# Patient Record
Sex: Female | Born: 1965 | Race: White | Hispanic: No | Marital: Married | Smoking: Never smoker
Health system: Southern US, Community
[De-identification: ages and names within clinical notes are randomized; demographics above are authoritative.]

## PROBLEM LIST (undated history)

## (undated) DIAGNOSIS — I10 Essential (primary) hypertension: Secondary | ICD-10-CM

## (undated) DIAGNOSIS — G500 Trigeminal neuralgia: Secondary | ICD-10-CM

## (undated) DIAGNOSIS — K219 Gastro-esophageal reflux disease without esophagitis: Secondary | ICD-10-CM

## (undated) DIAGNOSIS — G4730 Sleep apnea, unspecified: Secondary | ICD-10-CM

## (undated) DIAGNOSIS — I4891 Unspecified atrial fibrillation: Secondary | ICD-10-CM

## (undated) DIAGNOSIS — D497 Neoplasm of unspecified behavior of endocrine glands and other parts of nervous system: Secondary | ICD-10-CM

## (undated) HISTORY — PX: BREAST SURGERY: SHX581

## (undated) HISTORY — PX: CHOLECYSTECTOMY: SHX55

---

## 2008-10-18 ENCOUNTER — Emergency Department (HOSPITAL_BASED_OUTPATIENT_CLINIC_OR_DEPARTMENT_OTHER): Admission: EM | Admit: 2008-10-18 | Discharge: 2008-10-19 | Payer: Self-pay | Admitting: Emergency Medicine

## 2008-11-16 ENCOUNTER — Ambulatory Visit (HOSPITAL_COMMUNITY): Admission: RE | Admit: 2008-11-16 | Discharge: 2008-11-16 | Payer: Self-pay | Admitting: Cardiovascular Disease

## 2009-04-17 ENCOUNTER — Emergency Department (HOSPITAL_BASED_OUTPATIENT_CLINIC_OR_DEPARTMENT_OTHER): Admission: EM | Admit: 2009-04-17 | Discharge: 2009-04-17 | Payer: Self-pay | Admitting: Emergency Medicine

## 2009-04-17 ENCOUNTER — Ambulatory Visit: Payer: Self-pay | Admitting: Diagnostic Radiology

## 2010-08-02 ENCOUNTER — Emergency Department (HOSPITAL_BASED_OUTPATIENT_CLINIC_OR_DEPARTMENT_OTHER): Admission: EM | Admit: 2010-08-02 | Discharge: 2010-08-02 | Payer: Self-pay | Admitting: Emergency Medicine

## 2011-03-19 LAB — BASIC METABOLIC PANEL
CO2: 30 mEq/L (ref 19–32)
Calcium: 8.8 mg/dL (ref 8.4–10.5)
Chloride: 103 mEq/L (ref 96–112)
GFR calc non Af Amer: >60 mL/min (ref >60)
Glucose, Bld: 92 mg/dL (ref 70–99)
Potassium: 3.9 mEq/L (ref 3.5–5.1)
Sodium: 141 mEq/L (ref 135–145)

## 2011-03-19 LAB — POCT CARDIAC MARKERS: Myoglobin, poc: 42.7 ng/mL (ref 12–200)

## 2011-03-19 LAB — CBC
Hemoglobin: 13.5 g/dL (ref 12.0–15.0)
Platelets: 257 10*3/uL (ref 150–400)
RBC: 4.43 MIL/uL (ref 3.87–5.11)
RDW: 12.6 % (ref 11.5–15.5)
WBC: 6.6 10*3/uL (ref 4.0–10.5)

## 2011-03-19 LAB — DIFFERENTIAL
Lymphs Abs: 1.9 10*3/uL (ref 0.7–4.0)
Monocytes Absolute: 0.6 10*3/uL (ref 0.1–1.0)
Neutro Abs: 3.9 10*3/uL (ref 1.7–7.7)
Neutrophils Relative %: 59 % (ref 43–77)

## 2011-09-10 LAB — BASIC METABOLIC PANEL
BUN: 17
CO2: 26
Creatinine, Ser: 0.7
GFR calc Af Amer: >60
Potassium: 3.9
Sodium: 137

## 2011-09-10 LAB — POCT CARDIAC MARKERS
CKMB, poc: <1 — ABNORMAL LOW
CKMB, poc: <1 — ABNORMAL LOW
Troponin i, poc: <0.05

## 2011-09-10 LAB — CBC
Hemoglobin: 14
MCHC: 33.8
Platelets: 243
RDW: 12.2

## 2011-09-10 LAB — DIFFERENTIAL
Basophils Absolute: 0.1
Basophils Relative: 1
Eosinophils Relative: 2
Lymphocytes Relative: 30

## 2012-04-30 ENCOUNTER — Emergency Department (HOSPITAL_BASED_OUTPATIENT_CLINIC_OR_DEPARTMENT_OTHER): Payer: Managed Care, Other (non HMO)

## 2012-04-30 ENCOUNTER — Encounter (HOSPITAL_BASED_OUTPATIENT_CLINIC_OR_DEPARTMENT_OTHER): Payer: Self-pay | Admitting: *Deleted

## 2012-04-30 ENCOUNTER — Emergency Department (HOSPITAL_BASED_OUTPATIENT_CLINIC_OR_DEPARTMENT_OTHER)
Admission: EM | Admit: 2012-04-30 | Discharge: 2012-05-01 | Disposition: A | Discharge status: Home / Self Care | Payer: Managed Care, Other (non HMO) | Attending: Emergency Medicine | Admitting: Emergency Medicine

## 2012-04-30 DIAGNOSIS — I10 Essential (primary) hypertension: Secondary | ICD-10-CM | POA: Insufficient documentation

## 2012-04-30 DIAGNOSIS — R1013 Epigastric pain: Secondary | ICD-10-CM | POA: Insufficient documentation

## 2012-04-30 DIAGNOSIS — Z79899 Other long term (current) drug therapy: Secondary | ICD-10-CM | POA: Insufficient documentation

## 2012-04-30 DIAGNOSIS — E669 Obesity, unspecified: Secondary | ICD-10-CM | POA: Insufficient documentation

## 2012-04-30 HISTORY — DX: Essential (primary) hypertension: I10

## 2012-04-30 LAB — DIFFERENTIAL
Basophils Absolute: 0 10*3/uL (ref 0.0–0.1)
Basophils Relative: 1 % (ref 0–1)
Lymphocytes Relative: 33 % (ref 12–46)
Lymphs Abs: 2.6 10*3/uL (ref 0.7–4.0)
Monocytes Absolute: 0.8 10*3/uL (ref 0.1–1.0)
Monocytes Relative: 11 % (ref 3–12)
Neutrophils Relative %: 54 % (ref 43–77)

## 2012-04-30 LAB — COMPREHENSIVE METABOLIC PANEL
ALT: 33 U/L (ref 0–35)
AST: 23 U/L (ref 0–37)
Albumin: 3.5 g/dL (ref 3.5–5.2)
Alkaline Phosphatase: 97 U/L (ref 39–117)
Chloride: 102 mEq/L (ref 96–112)
Potassium: 4.1 mEq/L (ref 3.5–5.1)
Sodium: 137 mEq/L (ref 135–145)
Total Bilirubin: 0.3 mg/dL (ref 0.3–1.2)
Total Protein: 7 g/dL (ref 6.0–8.3)

## 2012-04-30 LAB — URINE MICROSCOPIC-ADD ON

## 2012-04-30 LAB — URINALYSIS, ROUTINE W REFLEX MICROSCOPIC
Bilirubin Urine: NEGATIVE
Glucose, UA: NEGATIVE mg/dL
Hgb urine dipstick: NEGATIVE
Nitrite: NEGATIVE
Protein, ur: NEGATIVE mg/dL
Urobilinogen, UA: 0.2 mg/dL (ref 0.0–1.0)
pH: 5 (ref 5.0–8.0)

## 2012-04-30 LAB — CBC
HCT: 41.6 % (ref 36.0–46.0)
Hemoglobin: 14.4 g/dL (ref 12.0–15.0)
MCV: 87.4 fL (ref 78.0–100.0)
Platelets: 196 10*3/uL (ref 150–400)
RDW: 14 % (ref 11.5–15.5)
WBC: 7.7 10*3/uL (ref 4.0–10.5)

## 2012-04-30 LAB — LIPASE, BLOOD: Lipase: 38 U/L (ref 11–59)

## 2012-04-30 MED ORDER — MORPHINE SULFATE 4 MG/ML IJ SOLN
4.0000 mg | Freq: Once | INTRAMUSCULAR | Status: AC
  Administered 2012-04-30: 4 mg via INTRAVENOUS
  Filled 2012-04-30: qty 1

## 2012-04-30 NOTE — ED Provider Notes (Signed)
History     CSN: [id number]  Arrival date & time 04/30/12  2014   First MD Initiated Contact with Patient 04/30/12 2134      Chief Complaint  Patient presents with  . Abdominal Pain    (Consider location/radiation/quality/duration/timing/severity/associated sxs/prior treatment) HPI Comments: Patient presents with complaints of right upper quadrant epigastric sharp abdominal pain over the last week.  It has been somewhat intermittent in nature.  It is not related to eating.  She has already had her gallbladder removed.  When the pain was more severe today she had some nausea but no vomiting.  No changes in bowel habits.  No urinary symptoms.  Patient has not noted any specific inciting or relieving factors.  She was seen at an urgent care and had laboratory studies drawn but does not have those results.  Patient has had no other abdominal surgeries.  No fevers.  Patient is a 46 y.o. female presenting with abdominal pain. The history is provided by the patient.  Abdominal Pain The primary symptoms of the illness include abdominal pain and nausea. The primary symptoms of the illness do not include fever, shortness of breath, vomiting, diarrhea, dysuria or vaginal discharge.  Symptoms associated with the illness do not include chills or back pain.    Past Medical History  Diagnosis Date  . Hypertension     Past Surgical History  Procedure Date  . Cholecystectomy   . Breast surgery     No family history on file.  History  Substance Use Topics  . Smoking status: Never Smoker   . Smokeless tobacco: Not on file  . Alcohol Use: No    OB History    Grav Para Term Preterm Abortions TAB SAB Ect Mult Living                  Review of Systems  Constitutional: Negative.  Negative for fever and chills.  HENT: Negative.   Eyes: Negative.  Negative for discharge and redness.  Respiratory: Negative.  Negative for cough and shortness of breath.   Cardiovascular: Negative.  Negative  for chest pain.  Gastrointestinal: Positive for nausea and abdominal pain. Negative for vomiting and diarrhea.  Genitourinary: Negative.  Negative for dysuria and vaginal discharge.  Musculoskeletal: Negative.  Negative for back pain.  Skin: Negative.  Negative for color change and rash.  Neurological: Negative.  Negative for syncope and headaches.  Hematological: Negative.  Negative for adenopathy.  Psychiatric/Behavioral: Negative.  Negative for confusion.  All other systems reviewed and are negative.    Allergies  Codeine  Home Medications   Current Outpatient Rx  Name Route Sig Dispense Refill  . GABAPENTIN 300 MG PO CAPS Oral Take 300 mg by mouth 3 (three) times daily.    Marland Kitchen LISINOPRIL-HYDROCHLOROTHIAZIDE 10-12.5 MG PO TABS Oral Take 1 tablet by mouth daily.    . MOMETASONE FUROATE 50 MCG/ACT NA SUSP Nasal Place 2 sprays into the nose daily.    Marland Kitchen OMEPRAZOLE 40 MG PO CPDR Oral Take 40 mg by mouth daily.    Marland Kitchen VITAMIN D (ERGOCALCIFEROL) 50000 UNITS PO CAPS Oral Take 50,000 Units by mouth every 7 (seven) days. On Mondays      BP 162/78  Pulse 66  Temp(Src) 98.3 F (36.8 C) (Oral)  Resp 22  SpO2 99%  Physical Exam  Nursing note and vitals reviewed. Constitutional: She is oriented to person, place, and time. She appears well-developed and well-nourished.  Non-toxic appearance. She does not have a sickly  appearance.  HENT:  Head: Normocephalic and atraumatic.  Eyes: Conjunctivae, EOM and lids are normal. Pupils are equal, round, and reactive to light. No scleral icterus.  Neck: Trachea normal and normal range of motion. Neck supple.  Cardiovascular: Normal rate, regular rhythm and normal heart sounds.   Pulmonary/Chest: Effort normal and breath sounds normal. No respiratory distress. She has no wheezes. She has no rales.  Abdominal: Soft. Normal appearance. There is tenderness. There is no rebound, no guarding and no CVA tenderness.       Obese abdomen  Genitourinary:        No CVA tenderness  Musculoskeletal: Normal range of motion.  Neurological: She is alert and oriented to person, place, and time. She has normal strength.  Skin: Skin is warm, dry and intact. No rash noted.  Psychiatric: She has a normal mood and affect. Her behavior is normal. Judgment and thought content normal.    ED Course  Procedures (including critical care time)  Results for orders placed during the hospital encounter of 04/30/12  URINALYSIS, ROUTINE W REFLEX MICROSCOPIC      Component Value Range   Color, Urine YELLOW  YELLOW    APPearance CLOUDY (*) CLEAR    Specific Gravity, Urine 1.029  1.005 - 1.030    pH 5.0  5.0 - 8.0    Glucose, UA NEGATIVE  NEGATIVE (mg/dL)   Hgb urine dipstick NEGATIVE  NEGATIVE    Bilirubin Urine NEGATIVE  NEGATIVE    Ketones, ur NEGATIVE  NEGATIVE (mg/dL)   Protein, ur NEGATIVE  NEGATIVE (mg/dL)   Urobilinogen, UA 0.2  0.0 - 1.0 (mg/dL)   Nitrite NEGATIVE  NEGATIVE    Leukocytes, UA SMALL (*) NEGATIVE   URINE MICROSCOPIC-ADD ON      Component Value Range   Squamous Epithelial / LPF FEW (*) RARE    WBC, UA 3-6  <3 (WBC/hpf)   RBC / HPF 0-2  <3 (RBC/hpf)   Bacteria, UA FEW (*) RARE    No results found.     MDM  Patient with right upper quadrant epigastric pain over the last week.  She has already had a cholecystectomy in does not have significant nausea or vomiting or changes eating to suggest specific GI cause of her symptoms.  Given the location of her pain I will obtain a CBC, CMP and lipase.  As I am less concerned to evaluate for her gallbladder and the patient has an elevated BMI I feel that a more appropriate imaging study to further evaluate this is a CT scan.  Patient is difficult to obtain IV access on is multiple nurses and other staff have tried without success.  We'll obtain a CT scan with by mouth contrast only.        Nat Christen, MD 04/30/12 [phone]

## 2012-04-30 NOTE — ED Notes (Signed)
Right mid abdominal pain x 1 week. Was seen at [facility] yesterday and had lab work but does not have the results. Pain is worse today.

## 2012-05-01 MED ORDER — OXYCODONE-ACETAMINOPHEN 5-325 MG PO TABS: 1.0000 | ORAL_TABLET | Freq: Four times a day (QID) | ORAL | Status: AC | PRN

## 2012-05-01 NOTE — Discharge Instructions (Signed)

## 2012-08-31 ENCOUNTER — Encounter (HOSPITAL_BASED_OUTPATIENT_CLINIC_OR_DEPARTMENT_OTHER): Payer: Self-pay | Admitting: *Deleted

## 2012-08-31 ENCOUNTER — Emergency Department (HOSPITAL_BASED_OUTPATIENT_CLINIC_OR_DEPARTMENT_OTHER): Payer: Managed Care, Other (non HMO)

## 2012-08-31 ENCOUNTER — Emergency Department (HOSPITAL_BASED_OUTPATIENT_CLINIC_OR_DEPARTMENT_OTHER)
Admission: EM | Admit: 2012-08-31 | Discharge: 2012-09-01 | Disposition: A | Discharge status: Home / Self Care | Payer: Managed Care, Other (non HMO) | Attending: Emergency Medicine | Admitting: Emergency Medicine

## 2012-08-31 DIAGNOSIS — Z9089 Acquired absence of other organs: Secondary | ICD-10-CM | POA: Insufficient documentation

## 2012-08-31 DIAGNOSIS — R209 Unspecified disturbances of skin sensation: Secondary | ICD-10-CM | POA: Insufficient documentation

## 2012-08-31 DIAGNOSIS — M79609 Pain in unspecified limb: Secondary | ICD-10-CM | POA: Insufficient documentation

## 2012-08-31 DIAGNOSIS — I10 Essential (primary) hypertension: Secondary | ICD-10-CM | POA: Insufficient documentation

## 2012-08-31 DIAGNOSIS — Z886 Allergy status to analgesic agent status: Secondary | ICD-10-CM | POA: Insufficient documentation

## 2012-08-31 DIAGNOSIS — M79603 Pain in arm, unspecified: Secondary | ICD-10-CM

## 2012-08-31 DIAGNOSIS — R6884 Jaw pain: Secondary | ICD-10-CM | POA: Insufficient documentation

## 2012-08-31 HISTORY — DX: Neoplasm of unspecified behavior of endocrine glands and other parts of nervous system: D49.7

## 2012-08-31 NOTE — ED Notes (Signed)
States her heart rate has been slow, she is having pain in her jaw and left arm. Pituitary tumor. She is suppose to wear a holter monitor Wed for same complaints.

## 2012-08-31 NOTE — ED Provider Notes (Signed)
History   This chart was scribed for Tish Begin Smitty Cords, MD by Sofie Rower. The patient was seen in room MH01/MH01 and the patient's care was started at 10:28PM    CSN: [id number]  Arrival date & time 08/31/12  2200   First MD Initiated Contact with Patient 08/31/12 2228      Chief Complaint  Patient presents with  . Chest Pain    (Consider location/radiation/quality/duration/timing/severity/associated sxs/prior treatment) Patient is a 46 y.o. female presenting with extremity pain. The history is provided by the patient. No language interpreter was used.  Extremity Pain This is a new problem. The current episode started 1 to 2 hours ago. The problem occurs constantly. The problem has been gradually improving. Pertinent negatives include no chest pain, no abdominal pain, no headaches and no shortness of breath. Nothing aggravates the symptoms. Nothing relieves the symptoms. She has tried nothing for the symptoms. The treatment provided no relief.  No DOE no SOB, no n/v/d.  Has a history of neuropathic pain but states it usually does not last this long so she came in for her arm pain.  No travel.  PERC and wells negative.    Addasyn Mcbreen is a 46 y.o. female who presents to the Emergency Department complaining of   sudden, progressively worsening, arm pain located at the left arm radiating downwards towards the left hand onset today with associated symptoms of jaw pain and tingling sensation in the left upper extremity. The pt reports the left arm pain is a constant, aching pain which last 40 minutes in duration. The pt has a hx of hypertension, pituitary tumor, cholecystectomy, and breast surgery.   The pt denies any chest pain associated with the arm pain.   The pt does not smoke or drink alcohol.   Cardiologist is Dr. Heron Nay.    Past Medical History  Diagnosis Date  . Hypertension   . Pituitary tumor     Past Surgical History  Procedure Date  . Cholecystectomy   . Breast  surgery     No family history on file.  History  Substance Use Topics  . Smoking status: Never Smoker   . Smokeless tobacco: Not on file  . Alcohol Use: No    OB History    Grav Para Term Preterm Abortions TAB SAB Ect Mult Living                  Review of Systems  Constitutional: Negative for diaphoresis and fatigue.  Respiratory: Negative for chest tightness and shortness of breath.   Cardiovascular: Negative for chest pain.  Gastrointestinal: Negative for nausea, vomiting and abdominal pain.  Neurological: Negative for headaches.  All other systems reviewed and are negative.    Allergies  Codeine  Home Medications   Current Outpatient Rx  Name Route Sig Dispense Refill  . GABAPENTIN 300 MG PO CAPS Oral Take 300 mg by mouth 3 (three) times daily.    Marland Kitchen LISINOPRIL-HYDROCHLOROTHIAZIDE 10-12.5 MG PO TABS Oral Take 1 tablet by mouth daily.    . MOMETASONE FUROATE 50 MCG/ACT NA SUSP Nasal Place 2 sprays into the nose daily.    Marland Kitchen OMEPRAZOLE 40 MG PO CPDR Oral Take 40 mg by mouth daily.    Marland Kitchen VITAMIN D (ERGOCALCIFEROL) 50000 UNITS PO CAPS Oral Take 50,000 Units by mouth every 7 (seven) days. On Mondays      BP 125/89  Pulse 64  Temp 98.2 F (36.8 C) (Oral)  Resp 20  SpO2 100%  Physical  Exam  Nursing note and vitals reviewed. Constitutional: She is oriented to person, place, and time. She appears well-developed and well-nourished.  HENT:  Head: Normocephalic and atraumatic.  Nose: Nose normal.  Mouth/Throat: Oropharynx is clear and moist.  Eyes: Conjunctivae normal are normal. Pupils are equal, round, and reactive to light.  Neck: Normal range of motion. Neck supple. No JVD present.  Cardiovascular: Normal rate, regular rhythm and normal heart sounds.   Pulmonary/Chest: Effort normal and breath sounds normal. She exhibits no tenderness.  Abdominal: Soft. Bowel sounds are normal. There is no tenderness. There is no rebound and no guarding.  Musculoskeletal: Normal  range of motion. She exhibits no tenderness.  Neurological: She is alert and oriented to person, place, and time.  Skin: Skin is warm and dry.  Psychiatric: She has a normal mood and affect. Her behavior is normal.    ED Course  Procedures (including critical care time)  DIAGNOSTIC STUDIES: Oxygen Saturation is 100% on room air, normal by my interpretation.    COORDINATION OF CARE:    10:36PM- EKG results and blood work discussed with patient. Pt agrees with treatment.     Results for orders placed during the hospital encounter of 08/31/12  CBC WITH DIFFERENTIAL      Component Value Range   WBC 6.0  4.0 - 10.5 K/uL   RBC 4.32  3.87 - 5.11 MIL/uL   Hemoglobin 13.0  12.0 - 15.0 g/dL   HCT 16.1  09.6 - 04.5 %   MCV 87.5  78.0 - 100.0 fL   MCH 30.1  26.0 - 34.0 pg   MCHC 34.4  30.0 - 36.0 g/dL   RDW 40.9  81.1 - 91.4 %   Platelets 225  150 - 400 K/uL   Neutrophils Relative 56  43 - 77 %   Neutro Abs 3.4  1.7 - 7.7 K/uL   Lymphocytes Relative 31  12 - 46 %   Lymphs Abs 1.9  0.7 - 4.0 K/uL   Monocytes Relative 10  3 - 12 %   Monocytes Absolute 0.6  0.1 - 1.0 K/uL   Eosinophils Relative 3  0 - 5 %   Eosinophils Absolute 0.2  0.0 - 0.7 K/uL   Basophils Relative 0  0 - 1 %   Basophils Absolute 0.0  0.0 - 0.1 K/uL   Dg Chest 2 View  09/01/2012  *RADIOLOGY REPORT*  Clinical Data: Chest pain.  Left upper extremity pain.  CHEST - 2 VIEW  Comparison: 04/17/2009.  Findings:  Cardiopericardial silhouette within normal limits. Mediastinal contours normal. Trachea midline.  No airspace disease or effusion.  IMPRESSION: No active cardiopulmonary disease.   Original Report Authenticated By: Andreas Newport, M.D.          No diagnosis found.    MDM   Date: 09/01/2012  Rate: 65  Rhythm: normal sinus rhythm  QRS Axis: normal  Intervals: normal  ST/T Wave abnormalities: normal  Conduction Disutrbances: none  Narrative Interpretation: unremarkable   Strongly doubt ACS, seems  radicular in nature.  In the setting of negative ekg AND 2 negative troponins it is sufficient to exclude ACS.  Follow up with cardiology for outpatient stress test, patient has appointment this week.  Return immediately for chest pain shortness of breath or any concerns.  Patient verbalizes understanding and agrees to follow up    I personally performed the services described in this documentation, which was scribed in my presence. The recorded information has been reviewed and  considered.     Jasmine Awe, MD 09/01/12 [phone]

## 2012-09-01 LAB — CBC WITH DIFFERENTIAL/PLATELET
Basophils Relative: 0 % (ref 0–1)
Eosinophils Relative: 3 % (ref 0–5)
HCT: 37.8 % (ref 36.0–46.0)
Hemoglobin: 13 g/dL (ref 12.0–15.0)
MCH: 30.1 pg (ref 26.0–34.0)
MCHC: 34.4 g/dL (ref 30.0–36.0)
MCV: 87.5 fL (ref 78.0–100.0)
Monocytes Absolute: 0.6 10*3/uL (ref 0.1–1.0)
Monocytes Relative: 10 % (ref 3–12)
Neutro Abs: 3.4 10*3/uL (ref 1.7–7.7)

## 2012-09-01 LAB — COMPREHENSIVE METABOLIC PANEL
Albumin: 3.7 g/dL (ref 3.5–5.2)
BUN: 20 mg/dL (ref 6–23)
CO2: 25 mEq/L (ref 19–32)
Calcium: 9.3 mg/dL (ref 8.4–10.5)
Chloride: 101 mEq/L (ref 96–112)
Creatinine, Ser: 0.7 mg/dL (ref 0.50–1.10)
GFR calc non Af Amer: >90 mL/min (ref >90)
Total Bilirubin: 0.3 mg/dL (ref 0.3–1.2)

## 2012-09-01 LAB — TROPONIN I
Troponin I: <0.3 ng/mL (ref <0.30)
Troponin I: <0.3 ng/mL (ref <0.30)

## 2012-09-01 MED ORDER — KETOROLAC TROMETHAMINE 60 MG/2ML IM SOLN: 60.0000 mg | Freq: Once | INTRAMUSCULAR | Status: DC

## 2012-09-01 NOTE — ED Notes (Signed)
Pt refused Toradol

## 2013-02-12 ENCOUNTER — Emergency Department (HOSPITAL_BASED_OUTPATIENT_CLINIC_OR_DEPARTMENT_OTHER)
Admission: EM | Admit: 2013-02-12 | Discharge: 2013-02-12 | Disposition: A | Discharge status: Home / Self Care | Payer: BC Managed Care – PPO | Attending: Emergency Medicine | Admitting: Emergency Medicine

## 2013-02-12 ENCOUNTER — Encounter (HOSPITAL_BASED_OUTPATIENT_CLINIC_OR_DEPARTMENT_OTHER): Payer: Self-pay | Admitting: *Deleted

## 2013-02-12 ENCOUNTER — Emergency Department (HOSPITAL_BASED_OUTPATIENT_CLINIC_OR_DEPARTMENT_OTHER): Payer: BC Managed Care – PPO

## 2013-02-12 DIAGNOSIS — Z8639 Personal history of other endocrine, nutritional and metabolic disease: Secondary | ICD-10-CM | POA: Insufficient documentation

## 2013-02-12 DIAGNOSIS — I10 Essential (primary) hypertension: Secondary | ICD-10-CM | POA: Insufficient documentation

## 2013-02-12 DIAGNOSIS — G8929 Other chronic pain: Secondary | ICD-10-CM | POA: Insufficient documentation

## 2013-02-12 DIAGNOSIS — M545 Low back pain, unspecified: Secondary | ICD-10-CM | POA: Insufficient documentation

## 2013-02-12 DIAGNOSIS — Z79899 Other long term (current) drug therapy: Secondary | ICD-10-CM | POA: Insufficient documentation

## 2013-02-12 DIAGNOSIS — Z862 Personal history of diseases of the blood and blood-forming organs and certain disorders involving the immune mechanism: Secondary | ICD-10-CM | POA: Insufficient documentation

## 2013-02-12 MED ORDER — HYDROCODONE-ACETAMINOPHEN 5-325 MG PO TABS
1.0000 | ORAL_TABLET | Freq: Once | ORAL | Status: AC
  Administered 2013-02-12: 1 via ORAL
  Filled 2013-02-12: qty 1

## 2013-02-12 MED ORDER — DIAZEPAM 5 MG PO TABS
5.0000 mg | ORAL_TABLET | Freq: Once | ORAL | Status: AC
  Administered 2013-02-12: 5 mg via ORAL
  Filled 2013-02-12: qty 1

## 2013-02-12 MED ORDER — IBUPROFEN 800 MG PO TABS: 800.0000 mg | ORAL_TABLET | Freq: Three times a day (TID) | ORAL | Status: AC

## 2013-02-12 MED ORDER — HYDROCODONE-ACETAMINOPHEN 5-325 MG PO TABS: 1.0000 | ORAL_TABLET | Freq: Three times a day (TID) | ORAL | Status: DC | PRN

## 2013-02-12 MED ORDER — IBUPROFEN 800 MG PO TABS
800.0000 mg | ORAL_TABLET | Freq: Once | ORAL | Status: AC
  Administered 2013-02-12: 800 mg via ORAL
  Filled 2013-02-12: qty 1

## 2013-02-12 MED ORDER — DIAZEPAM 5 MG PO TABS: 5.0000 mg | ORAL_TABLET | Freq: Two times a day (BID) | ORAL | Status: DC | PRN

## 2013-02-12 NOTE — ED Provider Notes (Signed)
History     CSN: [id number]  Arrival date & time 02/12/13  1322   First MD Initiated Contact with Patient 02/12/13 1323      Chief Complaint  Patient presents with  . Back Pain     HPI  The patient presents with back pain.  Pain began 2 days ago, suddenly after an awkward motion.  The patient has a history of pain in a similar area in the distant past, including multiple steroid injections.  She notes that she was generally well in the interval without significant exacerbations recently.  Following the episodes of is that she has had persistent pain in her low back, midline, sharp, worse with any motion or weight-bearing.  For the past days she has also developed pain in her right lower extremity, posteriorly, worse with motion.  She denies interval incontinence, lower extremity weakness or dysesthesia.  She also denies any fevers, chills, or any other focal complaints. She has not taken any medication for pain relief in the past few days.   Past Medical History  Diagnosis Date  . Hypertension   . Pituitary tumor     Past Surgical History  Procedure Laterality Date  . Cholecystectomy    . Breast surgery      History reviewed. No pertinent family history.  History  Substance Use Topics  . Smoking status: Never Smoker   . Smokeless tobacco: Not on file  . Alcohol Use: No    OB History   Grav Para Term Preterm Abortions TAB SAB Ect Mult Living                  Review of Systems  Constitutional:       Per HPI, otherwise negative  HENT:       Per HPI, otherwise negative  Respiratory:       Per HPI, otherwise negative  Cardiovascular:       Per HPI, otherwise negative  Gastrointestinal: Negative for vomiting.  Endocrine:       Negative aside from HPI  Genitourinary:       Neg aside from HPI   Musculoskeletal:       Per HPI, otherwise negative  Skin: Negative.   Neurological: Negative for syncope.    Allergies  Codeine  Home Medications   Current  Outpatient Rx  Name  Route  Sig  Dispense  Refill  . gabapentin (NEURONTIN) 300 MG capsule   Oral   Take 300 mg by mouth 3 (three) times daily.         Marland Kitchen lisinopril-hydrochlorothiazide (PRINZIDE,ZESTORETIC) 10-12.5 MG per tablet   Oral   Take 1 tablet by mouth daily.         . mometasone (NASONEX) 50 MCG/ACT nasal spray   Nasal   Place 2 sprays into the nose daily.         Marland Kitchen omeprazole (PRILOSEC) 40 MG capsule   Oral   Take 40 mg by mouth daily.         . Vitamin D, Ergocalciferol, (DRISDOL) 50000 UNITS CAPS   Oral   Take 50,000 Units by mouth every 7 (seven) days. On Mondays           BP 125/73  Pulse 64  Temp(Src) 97.7 F (36.5 C) (Oral)  Resp 18  Ht 5\' 5"  (1.651 m)  Wt 386 lb (175.088 kg)  BMI 64.23 kg/m2  SpO2 100%  LMP 01/26/2013  Physical Exam  Nursing note and vitals reviewed. Constitutional: She is  oriented to person, place, and time. She appears well-developed and well-nourished.  Obese, but generally well-appearing female  HENT:  Head: Normocephalic and atraumatic.  Eyes: Conjunctivae and EOM are normal.  Cardiovascular: Normal rate and regular rhythm.   Pulmonary/Chest: Effort normal and breath sounds normal. No stridor. No respiratory distress.  Abdominal: She exhibits no distension.  Musculoskeletal: She exhibits no edema.       Arms: The patient has appropriate strength in both lower extremities symmetrically.  No focal deficiencies or any deformities.  Neurological: She is alert and oriented to person, place, and time. No cranial nerve deficit.  Skin: Skin is warm and dry.  Psychiatric: She has a normal mood and affect.    ED Course  Procedures (including critical care time)  Labs Reviewed - No data to display Dg Lumbar Spine 2-3 Views  02/12/2013  *RADIOLOGY REPORT*  Clinical Data: Low back pain for 2 years, fell a few days ago  LUMBAR SPINE - 2-3 VIEW  Comparison: None Correlation:  CT abdomen and pelvis 05/01/2012  Findings: Five  non-rib bearing lumbar vertebrae. Osseous mineralization normal. Minimal scattered endplate spur formation. Minimal disc space narrowing L2-L3. Vertebral body heights maintained without fracture or subluxation. No bone destruction or gross evidence of spondylolysis. SI joints preserved.  IMPRESSION: No acute lumbar spine abnormalities.   Original Report Authenticated By: Ulyses Southward, M.D.      No diagnosis found.    MDM  This patient presents with the acute onset of back pain.  Given her history of multiple steroid injections, there is some suspicion for bony abnormality and/or compression fracture.  Absent incontinence, weakness, visual suspicion for neurologic changes, and the patient's x-ray does not demonstrate compression fracture.  She was discharged in stable condition to follow up with her neurologist and her primary care physician with analgesics     Gerhard Munch, MD 02/12/13 1432

## 2013-02-12 NOTE — ED Notes (Signed)
Pt states chronic back pain

## 2014-01-28 ENCOUNTER — Encounter (HOSPITAL_BASED_OUTPATIENT_CLINIC_OR_DEPARTMENT_OTHER): Payer: Self-pay | Admitting: Emergency Medicine

## 2014-01-28 ENCOUNTER — Emergency Department (HOSPITAL_BASED_OUTPATIENT_CLINIC_OR_DEPARTMENT_OTHER): Payer: BC Managed Care – PPO

## 2014-01-28 ENCOUNTER — Emergency Department (HOSPITAL_BASED_OUTPATIENT_CLINIC_OR_DEPARTMENT_OTHER)
Admission: EM | Admit: 2014-01-28 | Discharge: 2014-01-28 | Disposition: A | Discharge status: Home / Self Care | Payer: BC Managed Care – PPO | Attending: Emergency Medicine | Admitting: Emergency Medicine

## 2014-01-28 DIAGNOSIS — R0789 Other chest pain: Secondary | ICD-10-CM | POA: Insufficient documentation

## 2014-01-28 DIAGNOSIS — K219 Gastro-esophageal reflux disease without esophagitis: Secondary | ICD-10-CM | POA: Insufficient documentation

## 2014-01-28 DIAGNOSIS — Z85858 Personal history of malignant neoplasm of other endocrine glands: Secondary | ICD-10-CM | POA: Insufficient documentation

## 2014-01-28 DIAGNOSIS — Z8669 Personal history of other diseases of the nervous system and sense organs: Secondary | ICD-10-CM | POA: Insufficient documentation

## 2014-01-28 DIAGNOSIS — R002 Palpitations: Secondary | ICD-10-CM | POA: Insufficient documentation

## 2014-01-28 DIAGNOSIS — I4891 Unspecified atrial fibrillation: Secondary | ICD-10-CM | POA: Insufficient documentation

## 2014-01-28 DIAGNOSIS — IMO0002 Reserved for concepts with insufficient information to code with codable children: Secondary | ICD-10-CM | POA: Insufficient documentation

## 2014-01-28 DIAGNOSIS — Z79899 Other long term (current) drug therapy: Secondary | ICD-10-CM | POA: Insufficient documentation

## 2014-01-28 HISTORY — DX: Gastro-esophageal reflux disease without esophagitis: K21.9

## 2014-01-28 HISTORY — DX: Sleep apnea, unspecified: G47.30

## 2014-01-28 HISTORY — DX: Trigeminal neuralgia: G50.0

## 2014-01-28 LAB — I-STAT CHEM 8, ED
BUN: 16 mg/dL (ref 6–23)
CALCIUM ION: 1.15 mmol/L (ref 1.12–1.23)
CREATININE: 0.8 mg/dL (ref 0.50–1.10)
Chloride: 105 mEq/L (ref 96–112)
GLUCOSE: 92 mg/dL (ref 70–99)
HCT: 47 % — ABNORMAL HIGH (ref 36.0–46.0)
HEMOGLOBIN: 16 g/dL — AB (ref 12.0–15.0)
Potassium: 4.4 mEq/L (ref 3.7–5.3)
Sodium: 141 mEq/L (ref 137–147)
TCO2: 27 mmol/L (ref 0–100)

## 2014-01-28 LAB — CBC WITH DIFFERENTIAL/PLATELET
Basophils Absolute: 0 10*3/uL (ref 0.0–0.1)
Basophils Relative: 0 % (ref 0–1)
EOS ABS: 0.1 10*3/uL (ref 0.0–0.7)
EOS PCT: 2 % (ref 0–5)
HEMATOCRIT: 43.3 % (ref 36.0–46.0)
HEMOGLOBIN: 14.4 g/dL (ref 12.0–15.0)
LYMPHS ABS: 1.8 10*3/uL (ref 0.7–4.0)
LYMPHS PCT: 35 % (ref 12–46)
MCH: 30.1 pg (ref 26.0–34.0)
MCHC: 33.3 g/dL (ref 30.0–36.0)
MCV: 90.4 fL (ref 78.0–100.0)
MONO ABS: 0.4 10*3/uL (ref 0.1–1.0)
MONOS PCT: 8 % (ref 3–12)
Neutro Abs: 2.7 10*3/uL (ref 1.7–7.7)
Neutrophils Relative %: 54 % (ref 43–77)
PLATELETS: 178 10*3/uL (ref 150–400)
RBC: 4.79 MIL/uL (ref 3.87–5.11)
RDW: 13.4 % (ref 11.5–15.5)
WBC: 5 10*3/uL (ref 4.0–10.5)

## 2014-01-28 LAB — TROPONIN I: Troponin I: <0.3 ng/mL (ref <0.30)

## 2014-01-28 MED ORDER — APIXABAN 5 MG PO TABS: 5.0000 mg | ORAL_TABLET | Freq: Two times a day (BID) | ORAL | Status: AC

## 2014-01-28 NOTE — Discharge Instructions (Signed)
Atrial Fibrillation °Atrial fibrillation is a condition that causes your heart to beat irregularly. It may also cause your heart to beat faster than normal. Atrial fibrillation can prevent your heart from pumping blood normally. It increases your risk of stroke and heart problems. °HOME CARE °· Take medications as told by your doctor. °· Only take medications that your doctor says are safe. Some medications can make the condition worse or happen again. °· If blood thinners were prescribed by your doctor, take them exactly as told. Too much can cause bleeding. Too little and you will not have the needed protection against stroke and other problems. °· Perform blood tests at home if told by your doctor. °· Perform blood tests exactly as told by your doctor. °· Do not drink alcohol. °· Do not drink beverages with caffeine such as coffee, soda, and some teas. °· Maintain a healthy weight. °· Do not use diet pills unless your doctor says they are safe. They may make heart problems worse. °· Follow diet instructions as told by your doctor. °· Exercise regularly as told by your doctor. °· Keep all follow-up appointments. °GET HELP RIGHT AWAY IF:  °· You have chest or belly (abdominal) pain. °· You feel sick to your stomach (nauseous) °· You suddenly have swollen feet and ankles. °· You feel dizzy. °· You face, arms, or legs feel numb or weak. °· There is a change in your vision or speech. °· You notice a change in the speed, rhythm, or strength of your heartbeat. °· You suddenly begin peeing (urinating) more often. °· You get tired more easily when moving or exercising. °MAKE SURE YOU:  °· Understand these instructions. °· Will watch your condition. °· Will get help right away if you are not doing well or get worse. °Document Released: 09/03/2008 Document Revised: 03/22/2013 Document Reviewed: 01/05/2013 °[employer]® Patient Information ©2014 [employer]. ° °

## 2014-01-28 NOTE — ED Notes (Signed)
Pt reports HTN states that her HR has been in the 90s and generally its in the 42s.  Denies CP, denies SOB.

## 2014-01-28 NOTE — ED Provider Notes (Addendum)
CSN: [id number]     Arrival date & time 01/28/14  7169 History   First MD Initiated Contact with Patient 01/28/14 [id number]     Chief Complaint  Patient presents with  . Hypertension     (Consider location/radiation/quality/duration/timing/severity/associated sxs/prior Treatment) Patient is a 48 y.o. female presenting with palpitations. The history is provided by the patient.  Palpitations Palpitations quality:  Fast Onset quality:  Sudden Duration: has been off and on for the last 2 weeks but today has been ongoing since 6:30am. Timing:  Intermittent Progression:  Unchanged Chronicity:  New Context: not anxiety, not appetite suppressants, not bronchodilators, not caffeine, not exercise, not hyperventilation, not illicit drugs and not nicotine   Context comment:  Pt states 2 weeks ago she started meloxicam and 3 days after starting the meds she developed chest pain intermittently and occassional palpitations.  she stopped the med almost a week ago with resolution of sx until 2 days ago  Relieved by:  Nothing Worsened by:  Nothing tried Ineffective treatments:  None tried Associated symptoms: chest pain   Associated symptoms: no back pain, no chest pressure, no cough, no hemoptysis, no nausea, no near-syncope and no vomiting   Associated symptoms comment:  This am needs to take deep breaths Risk factors: no diabetes mellitus, no heart disease, no hx of atrial fibrillation, no hx of DVT, no hx of PE, no hx of thyroid disease, no hypercoagulable state and no hyperthyroidism     Past Medical History  Diagnosis Date  . Hypertension   . Pituitary tumor   . GERD (gastroesophageal reflux disease)   . Sleep apnea   . Trigeminal neuralgia    Past Surgical History  Procedure Laterality Date  . Cholecystectomy    . Breast surgery     History reviewed. No pertinent family history. History  Substance Use Topics  . Smoking status: Never Smoker   . Smokeless tobacco: Not on file  . Alcohol  Use: No   OB History   Grav Para Term Preterm Abortions TAB SAB Ect Mult Living                 Review of Systems  Constitutional: Negative for fever.  Respiratory: Negative for cough, hemoptysis and wheezing.   Cardiovascular: Positive for chest pain and palpitations. Negative for near-syncope.       Intermittent chest pain but none since last night.  Usually sharp and on the left side.  No radiation of pain  Gastrointestinal: Negative for nausea and vomiting.  Musculoskeletal: Negative for back pain.  All other systems reviewed and are negative.      Allergies  Codeine  Home Medications   Current Outpatient Rx  Name  Route  Sig  Dispense  Refill  . cabergoline (DOSTINEX) 0.5 MG tablet   Oral   Take 0.5 mg by mouth.         . gabapentin (NEURONTIN) 300 MG capsule   Oral   Take 300 mg by mouth 3 (three) times daily.         Marland Kitchen lisinopril-hydrochlorothiazide (PRINZIDE,ZESTORETIC) 10-12.5 MG per tablet   Oral   Take 1 tablet by mouth daily.         . mometasone (NASONEX) 50 MCG/ACT nasal spray   Nasal   Place 2 sprays into the nose daily.         Marland Kitchen omeprazole (PRILOSEC) 40 MG capsule   Oral   Take 40 mg by mouth daily.         Marland Kitchen  Vitamin D, Ergocalciferol, (DRISDOL) 50000 UNITS CAPS   Oral   Take 50,000 Units by mouth every 7 (seven) days. On Mondays          Temp(Src) 97.7 F (36.5 C) (Oral)  Resp 18  SpO2 100% Physical Exam  Nursing note and vitals reviewed. Constitutional: She is oriented to person, place, and time. She appears well-developed and well-nourished. No distress.  Morbidly obese  HENT:  Head: Normocephalic and atraumatic.  Mouth/Throat: Oropharynx is clear and moist.  Eyes: Conjunctivae and EOM are normal. Pupils are equal, round, and reactive to light.  Neck: Normal range of motion. Neck supple.  Cardiovascular: Normal rate and intact distal pulses.  An irregularly irregular rhythm present.  No murmur heard. Pulmonary/Chest:  Effort normal and breath sounds normal. No respiratory distress. She has no wheezes. She has no rales. She exhibits no tenderness.  Abdominal: Soft. She exhibits no distension. There is no tenderness. There is no rebound and no guarding.  Musculoskeletal: Normal range of motion. She exhibits no edema and no tenderness.  Neurological: She is alert and oriented to person, place, and time.  Skin: Skin is warm and dry. No rash noted. No erythema.  Psychiatric: She has a normal mood and affect. Her behavior is normal.    ED Course  Procedures (including critical care time) Labs Review Labs Reviewed  I-STAT CHEM 8, ED - Abnormal; Notable for the following:    Hemoglobin 16.0 (*)    HCT 47.0 (*)    All other components within normal limits  CBC WITH DIFFERENTIAL  TROPONIN I   Imaging Review Dg Chest 2 View  01/28/2014   CLINICAL DATA:  Chest pain  EXAM: CHEST  2 VIEW  COMPARISON:  September 01, 2012  FINDINGS: There is minimal scarring in the left base. Lungs are otherwise clear. Heart size and pulmonary vascularity are normal. No adenopathy. No bone lesions.  IMPRESSION: No edema or consolidation.   Electronically Signed   By: Bretta Bang M.D.   On: 01/28/2014 09:53    EKG Interpretation    Date/Time:  Friday January 28 2014 09:03:06 EST Ventricular Rate:  95 PR Interval:    QRS Duration: 84 QT Interval:  374 QTC Calculation: 469 R Axis:   30 Text Interpretation:  new  Atrial fibrillation Low voltage QRS Cannot rule out Anterior infarct , age undetermined Confirmed by Anitra Lauth  MD, Pantelis Elgersma ([id number]) on 01/28/2014 9:07:48 AM           EKG Interpretation    Date/Time:  Friday January 28 2014 10:30:23 EST Ventricular Rate:  77 PR Interval:    QRS Duration: 86 QT Interval:  386 QTC Calculation: 436 R Axis:   49 Text Interpretation:  Atrial fibrillation Possible Anterior infarct , age undetermined No significant change since last tracing Confirmed by Anitra Lauth  MD, Malvin Morrish  [phone]) on 01/28/2014 10:34:18 AM            MDM   Final diagnoses:  New onset atrial fibrillation    Patient presenting with complaints of palpitations that have been intermittent over the last one week with occasional chest pain that she contribute to starting meloxicam. If she stopped the meloxicam on Saturday chest pain and fluttering resolved at this morning from 6:30 on she's had persistent palpitations without chest pain but feels like she needs to take deeper breaths.  She states her long time she's had an irregular heartbeat where she will monitor it was completely evaluated and told that she needs  to take 25 mg of metoprolol daily but otherwise there were no concerning findings. She had a coronary CT done in 2009 which showed clear coronary arteries.  Today upon arrival patient's heart rate is in the 90s and irregularly irregular consistent with atrial fibrillation. Based on the patient's complaint of one to 2 weeks of intermittent palpitations concern for paroxysmal atrial fibrillation. Also she noted for the last 2 days her blood pressure has been higher than normal despite taking her regular home medications. She denies any recent travel, leg pain or swelling concerning for DVT. She is on no exogenous hormones.  CBC, i-STAT, troponin, chest x-ray pending and I will discuss with her cardiologist or [facility] for further recommendations.  10:04 AM Labs and CXR all wnl.  Repeat EKG shows persistent a.fib with rate in the 70's.  Will discuss with cardiology.  10:59 AM Discussed with the [facility]ologist who recommended that pt stay on her current dose of metoprolol and start eliquis and f/u for possible cardioversion.  Blanchie Dessert, MD 01/28/14 [place], MD 01/28/14 1101

## 2014-05-07 ENCOUNTER — Emergency Department (HOSPITAL_BASED_OUTPATIENT_CLINIC_OR_DEPARTMENT_OTHER)
Admission: EM | Admit: 2014-05-07 | Discharge: 2014-05-07 | Disposition: A | Discharge status: Home / Self Care | Payer: BC Managed Care – PPO | Attending: Emergency Medicine | Admitting: Emergency Medicine

## 2014-05-07 ENCOUNTER — Emergency Department (HOSPITAL_BASED_OUTPATIENT_CLINIC_OR_DEPARTMENT_OTHER): Payer: BC Managed Care – PPO

## 2014-05-07 ENCOUNTER — Encounter (HOSPITAL_BASED_OUTPATIENT_CLINIC_OR_DEPARTMENT_OTHER): Payer: Self-pay | Admitting: Emergency Medicine

## 2014-05-07 DIAGNOSIS — K219 Gastro-esophageal reflux disease without esophagitis: Secondary | ICD-10-CM | POA: Insufficient documentation

## 2014-05-07 DIAGNOSIS — Z8639 Personal history of other endocrine, nutritional and metabolic disease: Secondary | ICD-10-CM | POA: Insufficient documentation

## 2014-05-07 DIAGNOSIS — Z79899 Other long term (current) drug therapy: Secondary | ICD-10-CM | POA: Insufficient documentation

## 2014-05-07 DIAGNOSIS — Z8669 Personal history of other diseases of the nervous system and sense organs: Secondary | ICD-10-CM | POA: Insufficient documentation

## 2014-05-07 DIAGNOSIS — Z7902 Long term (current) use of antithrombotics/antiplatelets: Secondary | ICD-10-CM | POA: Insufficient documentation

## 2014-05-07 DIAGNOSIS — IMO0002 Reserved for concepts with insufficient information to code with codable children: Secondary | ICD-10-CM | POA: Insufficient documentation

## 2014-05-07 DIAGNOSIS — M5416 Radiculopathy, lumbar region: Secondary | ICD-10-CM

## 2014-05-07 DIAGNOSIS — I10 Essential (primary) hypertension: Secondary | ICD-10-CM | POA: Insufficient documentation

## 2014-05-07 DIAGNOSIS — R1013 Epigastric pain: Secondary | ICD-10-CM

## 2014-05-07 DIAGNOSIS — Z862 Personal history of diseases of the blood and blood-forming organs and certain disorders involving the immune mechanism: Secondary | ICD-10-CM | POA: Insufficient documentation

## 2014-05-07 HISTORY — DX: Unspecified atrial fibrillation: I48.91

## 2014-05-07 LAB — COMPREHENSIVE METABOLIC PANEL
ALT: 37 U/L — AB (ref 0–35)
AST: 37 U/L (ref 0–37)
Albumin: 3.6 g/dL (ref 3.5–5.2)
Alkaline Phosphatase: 121 U/L — ABNORMAL HIGH (ref 39–117)
BUN: 15 mg/dL (ref 6–23)
CO2: 26 mEq/L (ref 19–32)
CREATININE: 0.7 mg/dL (ref 0.50–1.10)
Calcium: 9.4 mg/dL (ref 8.4–10.5)
Chloride: 104 mEq/L (ref 96–112)
GFR calc Af Amer: >90 mL/min (ref >90)
GFR calc non Af Amer: >90 mL/min (ref >90)
Glucose, Bld: 93 mg/dL (ref 70–99)
Potassium: 3.9 mEq/L (ref 3.7–5.3)
SODIUM: 142 meq/L (ref 137–147)
TOTAL PROTEIN: 7.4 g/dL (ref 6.0–8.3)
Total Bilirubin: 0.6 mg/dL (ref 0.3–1.2)

## 2014-05-07 LAB — CBC WITH DIFFERENTIAL/PLATELET
BASOS PCT: 0 % (ref 0–1)
Basophils Absolute: 0 10*3/uL (ref 0.0–0.1)
EOS ABS: 0.1 10*3/uL (ref 0.0–0.7)
Eosinophils Relative: 1 % (ref 0–5)
HCT: 39.6 % (ref 36.0–46.0)
Hemoglobin: 13.4 g/dL (ref 12.0–15.0)
Lymphocytes Relative: 38 % (ref 12–46)
Lymphs Abs: 2.1 10*3/uL (ref 0.7–4.0)
MCH: 31.3 pg (ref 26.0–34.0)
MCHC: 33.8 g/dL (ref 30.0–36.0)
MCV: 92.5 fL (ref 78.0–100.0)
Monocytes Absolute: 0.6 10*3/uL (ref 0.1–1.0)
Monocytes Relative: 11 % (ref 3–12)
Neutro Abs: 2.7 10*3/uL (ref 1.7–7.7)
Neutrophils Relative %: 49 % (ref 43–77)
PLATELETS: 198 10*3/uL (ref 150–400)
RBC: 4.28 MIL/uL (ref 3.87–5.11)
RDW: 13.7 % (ref 11.5–15.5)
WBC: 5.6 10*3/uL (ref 4.0–10.5)

## 2014-05-07 LAB — TROPONIN I

## 2014-05-07 LAB — LIPASE, BLOOD: Lipase: 35 U/L (ref 11–59)

## 2014-05-07 MED ORDER — CYCLOBENZAPRINE HCL 10 MG PO TABS: 10.0000 mg | ORAL_TABLET | Freq: Two times a day (BID) | ORAL | Status: DC | PRN

## 2014-05-07 MED ORDER — DIAZEPAM 5 MG PO TABS
5.0000 mg | ORAL_TABLET | Freq: Once | ORAL | Status: AC
  Administered 2014-05-07: 5 mg via ORAL
  Filled 2014-05-07: qty 1

## 2014-05-07 MED ORDER — ONDANSETRON HCL 4 MG/2ML IJ SOLN
4.0000 mg | Freq: Once | INTRAMUSCULAR | Status: AC
  Administered 2014-05-07: 4 mg via INTRAVENOUS
  Filled 2014-05-07: qty 2

## 2014-05-07 MED ORDER — OXYCODONE-ACETAMINOPHEN 5-325 MG PO TABS: 1.0000 | ORAL_TABLET | ORAL | Status: DC | PRN

## 2014-05-07 MED ORDER — FENTANYL CITRATE 0.05 MG/ML IJ SOLN
50.0000 ug | Freq: Once | INTRAMUSCULAR | Status: AC
  Administered 2014-05-07: 50 ug via INTRAVENOUS
  Filled 2014-05-07: qty 2

## 2014-05-07 MED ORDER — HYDROMORPHONE HCL PF 2 MG/ML IJ SOLN
2.0000 mg | Freq: Once | INTRAMUSCULAR | Status: AC
  Administered 2014-05-07: 2 mg via INTRAMUSCULAR
  Filled 2014-05-07: qty 1

## 2014-05-07 MED ORDER — GI COCKTAIL ~~LOC~~
30.0000 mL | Freq: Once | ORAL | Status: AC
  Administered 2014-05-07: 30 mL via ORAL
  Filled 2014-05-07: qty 30

## 2014-05-07 NOTE — ED Provider Notes (Addendum)
CSN: [id number]     Arrival date & time 05/07/14  0312 History   First MD Initiated Contact with Patient 05/07/14 0350     Chief Complaint  Patient presents with  . Back Pain     (Consider location/radiation/quality/duration/timing/severity/associated sxs/prior Treatment) HPI Comments: Pt comes in with cc of back pain. Pt has hx of disk problems. States that this evening, after a long day at work, she started having some pain in the lower back with "burning" type pain radiating to her left side. Her pain is worse with lifting of her right foot when walking. No associated numbness, weakness, urinary incontinence, urinary retention, bowel incontinence, saddle anesthesia.     Patient is a 48 y.o. female presenting with back pain. The history is provided by the patient.  Back Pain Associated symptoms: no abdominal pain, no chest pain, no dysuria and no headaches     Past Medical History  Diagnosis Date  . Hypertension   . Pituitary tumor   . GERD (gastroesophageal reflux disease)   . Sleep apnea   . Trigeminal neuralgia   . A-fib    Past Surgical History  Procedure Laterality Date  . Cholecystectomy    . Breast surgery     No family history on file. History  Substance Use Topics  . Smoking status: Never Smoker   . Smokeless tobacco: Not on file  . Alcohol Use: No   OB History   Grav Para Term Preterm Abortions TAB SAB Ect Mult Living                 Review of Systems  Constitutional: Positive for activity change.  Respiratory: Negative for shortness of breath.   Cardiovascular: Negative for chest pain.  Gastrointestinal: Negative for nausea, vomiting and abdominal pain.  Genitourinary: Negative for dysuria.  Musculoskeletal: Positive for back pain. Negative for neck pain.  Neurological: Negative for headaches.      Allergies  Codeine  Home Medications   Prior to Admission medications   Medication Sig Start Date End Date Taking? Authorizing Provider   amLODipine (NORVASC) 2.5 MG tablet Take 2.5 mg by mouth daily.   Yes Historical Provider, MD  apixaban (ELIQUIS) 5 MG TABS tablet Take 1 tablet (5 mg total) by mouth 2 (two) times daily. 01/28/14   Blanchie Dessert, MD  cabergoline (DOSTINEX) 0.5 MG tablet Take 0.5 mg by mouth.    Historical Provider, MD  gabapentin (NEURONTIN) 300 MG capsule Take 300 mg by mouth 3 (three) times daily.    Historical Provider, MD  lisinopril-hydrochlorothiazide (PRINZIDE,ZESTORETIC) 10-12.5 MG per tablet Take 1 tablet by mouth daily.    Historical Provider, MD  mometasone (NASONEX) 50 MCG/ACT nasal spray Place 2 sprays into the nose daily.    Historical Provider, MD  omeprazole (PRILOSEC) 40 MG capsule Take 40 mg by mouth daily.    Historical Provider, MD  Vitamin D, Ergocalciferol, (DRISDOL) 50000 UNITS CAPS Take 50,000 Units by mouth every 7 (seven) days. On Mondays    Historical Provider, MD   BP 158/58  Pulse 59  Temp(Src) 98.4 F (36.9 C) (Oral)  Resp 20  SpO2 97% Physical Exam  Nursing note and vitals reviewed. Constitutional: She appears well-developed.  HENT:  Head: Normocephalic.  Eyes: Conjunctivae are normal.  Neck: Neck supple.  Cardiovascular: Normal rate.   Pulmonary/Chest: Effort normal.  Musculoskeletal:  Pt has tenderness over the lumbar region  No step offs, no erythema. + straight leg raise test bilaterally.    ED Course  Procedures (including critical care time) Labs Review Labs Reviewed  CBC WITH DIFFERENTIAL  TROPONIN I  COMPREHENSIVE METABOLIC PANEL  LIPASE, BLOOD    Imaging Review Dg Chest Port 1 View  05/07/2014   CLINICAL DATA:  Chest pain, epigastric pain  EXAM: PORTABLE CHEST - 1 VIEW  COMPARISON:  Prior radiograph from 01/28/2014  FINDINGS: Mild cardiomegaly is present. Mediastinal silhouette within normal limits.  Lungs are mildly hypoinflated. No focal infiltrate, pulmonary edema, or pleural effusion. No pneumothorax.  No soft tissue abnormality identified. No  acute osseous abnormality.  IMPRESSION: 1. No acute cardiopulmonary abnormality identified. 2. Mild cardiomegaly.   Electronically Signed   By: Jeannine Boga M.D.   On: 05/07/2014 06:09     EKG Interpretation   Date/Time:  Saturday May 07 2014 05:30:02 EDT Ventricular Rate:  54 PR Interval:  214 QRS Duration: 98 QT Interval:  462 QTC Calculation: 438 R Axis:   44 Text Interpretation:  Sinus bradycardia with 1st degree A-V block  Otherwise normal ECG Confirmed by Alton Tremblay, MD, Amisha Pospisil ([id number]) on 05/07/2014  6:08:25 AM      MDM   Final diagnoses:  None    Pt comes in with cc of back pain. Hx of disk problems - and she had a long day of work today, although no specific activity provoked her back pain that she can recall. Pt has no associated red flags with this back pain - and there is no concern for severe disk hernia, or spinal cord compression. No indication for imaging. Pt will be given a nsurgery f/u,  6:33 AM Pt was given dilaudid im and valium. She started having some chest pain few min after.  EKG ordered - and is neg. I reassessed her - and she reports epigastric pain, moderately severe, but getting worse. Pain is non radiating. She has no dib, no thorat related complains and no hx of allergic rxn. Lungs are clear.  Will get lipase, and basic labs. Will reassess.  Varney Biles, MD 05/07/14 1700  6:57 AM Pain improved. It is fluctuating between upper chest and epigastrium - ?esophageal spasms. Labs are benign. Will d.c  Varney Biles, MD 05/07/14 [phone]

## 2014-05-07 NOTE — ED Notes (Signed)
Pt c/o lower back pain radiating down lt hip, pain to center of back when lifting rt foot; denies injury

## 2014-05-07 NOTE — Discharge Instructions (Signed)
We saw you in the ER for back pain. The imaging in the exam is normal, and our exam don't indicate any spinal cord involvement - and thus we feel comfortable sending you home. Please use the back exercises to strengthen the back muscles. See your primary care doctor or spine doctor for further management.  Not sure what is causing your abdominal and chest discomfort. Seems like possible esophageal spasm. See your doctor if pain continues.   Back Exercises Back exercises help treat and prevent back injuries. The goal of back exercises is to increase the strength of your abdominal and back muscles and the flexibility of your back. These exercises should be started when you no longer have back pain. Back exercises include:  Pelvic Tilt. Lie on your back with your knees bent. Tilt your pelvis until the lower part of your back is against the floor. Hold this position 5 to 10 sec and repeat 5 to 10 times.  Knee to Chest. Pull first 1 knee up against your chest and hold for 20 to 30 seconds, repeat this with the other knee, and then both knees. This may be done with the other leg straight or bent, whichever feels better.  Sit-Ups or Curl-Ups. Bend your knees 90 degrees. Start with tilting your pelvis, and do a partial, slow sit-up, lifting your trunk only 30 to 45 degrees off the floor. Take at least 2 to 3 seconds for each sit-up. Do not do sit-ups with your knees out straight. If partial sit-ups are difficult, simply do the above but with only tightening your abdominal muscles and holding it as directed.  Hip-Lift. Lie on your back with your knees flexed 90 degrees. Push down with your feet and shoulders as you raise your hips a couple inches off the floor; hold for 10 seconds, repeat 5 to 10 times.  Back arches. Lie on your stomach, propping yourself up on bent elbows. Slowly press on your hands, causing an arch in your low back. Repeat 3 to 5 times. Any initial stiffness and discomfort should  lessen with repetition over time.  Shoulder-Lifts. Lie face down with arms beside your body. Keep hips and torso pressed to floor as you slowly lift your head and shoulders off the floor. Do not overdo your exercises, especially in the beginning. Exercises may cause you some mild back discomfort which lasts for a few minutes; however, if the pain is more severe, or lasts for more than 15 minutes, do not continue exercises until you see your caregiver. Improvement with exercise therapy for back problems is slow.  See your caregivers for assistance with developing a proper back exercise program. Document Released: 01/02/2005 Document Revised: 02/17/2012 Document Reviewed: 09/26/2011 [facility] Patient Information 2014 [place].  Radicular Pain Radicular pain in either the arm or leg is usually from a bulging or herniated disk in the spine. A piece of the herniated disk may press against the nerves as the nerves exit the spine. This causes pain which is felt at the tips of the nerves down the arm or leg. Other causes of radicular pain may include:  Fractures.  Heart disease.  Cancer.  An abnormal and usually degenerative state of the nervous system or nerves (neuropathy). Diagnosis may require CT or MRI scanning to determine the primary cause.  Nerves that start at the neck (nerve roots) may cause radicular pain in the outer shoulder and arm. It can spread down to the thumb and fingers. The symptoms vary depending on which  nerve root has been affected. In most cases radicular pain improves with conservative treatment. Neck problems may require physical therapy, a neck collar, or cervical traction. Treatment may take many weeks, and surgery may be considered if the symptoms do not improve.  Conservative treatment is also recommended for sciatica. Sciatica causes pain to radiate from the lower back or buttock area down the leg into the foot. Often there is a history of back problems. Most  patients with sciatica are better after 2 to 4 weeks of rest and other supportive care. Short term bed rest can reduce the disk pressure considerably. Sitting, however, is not a good position since this increases the pressure on the disk. You should avoid bending, lifting, and all other activities which make the problem worse. Traction can be used in severe cases. Surgery is usually reserved for patients who do not improve within the first months of treatment. Only take over-the-counter or prescription medicines for pain, discomfort, or fever as directed by your caregiver. Narcotics and muscle relaxants may help by relieving more severe pain and spasm and by providing mild sedation. Cold or massage can give significant relief. Spinal manipulation is not recommended. It can increase the degree of disc protrusion. Epidural steroid injections are often effective treatment for radicular pain. These injections deliver medicine to the spinal nerve in the space between the protective covering of the spinal cord and back bones (vertebrae). Your caregiver can give you more information about steroid injections. These injections are most effective when given within two weeks of the onset of pain.  You should see your caregiver for follow up care as recommended. A program for neck and back injury rehabilitation with stretching and strengthening exercises is an important part of management.  SEEK IMMEDIATE MEDICAL CARE IF:  You develop increased pain, weakness, or numbness in your arm or leg.  You develop difficulty with bladder or bowel control.  You develop abdominal pain. Document Released: 01/02/2005 Document Revised: 02/17/2012 Document Reviewed: 03/20/2009 [facility] Patient Information 2014 [place], [place].

## 2015-03-19 ENCOUNTER — Encounter (HOSPITAL_BASED_OUTPATIENT_CLINIC_OR_DEPARTMENT_OTHER): Payer: Self-pay | Admitting: *Deleted

## 2015-03-19 ENCOUNTER — Emergency Department (HOSPITAL_BASED_OUTPATIENT_CLINIC_OR_DEPARTMENT_OTHER): Payer: BLUE CROSS/BLUE SHIELD

## 2015-03-19 ENCOUNTER — Other Ambulatory Visit: Payer: Self-pay

## 2015-03-19 ENCOUNTER — Emergency Department (HOSPITAL_BASED_OUTPATIENT_CLINIC_OR_DEPARTMENT_OTHER)
Admission: EM | Admit: 2015-03-19 | Discharge: 2015-03-19 | Disposition: A | Discharge status: Home / Self Care | Payer: BLUE CROSS/BLUE SHIELD | Attending: Emergency Medicine | Admitting: Emergency Medicine

## 2015-03-19 DIAGNOSIS — Z8639 Personal history of other endocrine, nutritional and metabolic disease: Secondary | ICD-10-CM | POA: Insufficient documentation

## 2015-03-19 DIAGNOSIS — G500 Trigeminal neuralgia: Secondary | ICD-10-CM | POA: Insufficient documentation

## 2015-03-19 DIAGNOSIS — I10 Essential (primary) hypertension: Secondary | ICD-10-CM | POA: Diagnosis not present

## 2015-03-19 DIAGNOSIS — I4891 Unspecified atrial fibrillation: Secondary | ICD-10-CM

## 2015-03-19 DIAGNOSIS — Z7951 Long term (current) use of inhaled steroids: Secondary | ICD-10-CM | POA: Diagnosis not present

## 2015-03-19 DIAGNOSIS — K219 Gastro-esophageal reflux disease without esophagitis: Secondary | ICD-10-CM | POA: Insufficient documentation

## 2015-03-19 DIAGNOSIS — Z3202 Encounter for pregnancy test, result negative: Secondary | ICD-10-CM | POA: Insufficient documentation

## 2015-03-19 DIAGNOSIS — Z79899 Other long term (current) drug therapy: Secondary | ICD-10-CM | POA: Diagnosis not present

## 2015-03-19 DIAGNOSIS — Z7902 Long term (current) use of antithrombotics/antiplatelets: Secondary | ICD-10-CM | POA: Diagnosis not present

## 2015-03-19 DIAGNOSIS — R002 Palpitations: Secondary | ICD-10-CM | POA: Diagnosis present

## 2015-03-19 LAB — PREGNANCY, URINE: PREG TEST UR: NEGATIVE

## 2015-03-19 LAB — CBC WITH DIFFERENTIAL/PLATELET
Basophils Absolute: 0 10*3/uL (ref 0.0–0.1)
Basophils Relative: 1 % (ref 0–1)
EOS PCT: 2 % (ref 0–5)
Eosinophils Absolute: 0.1 10*3/uL (ref 0.0–0.7)
HCT: 42.4 % (ref 36.0–46.0)
HEMOGLOBIN: 13.7 g/dL (ref 12.0–15.0)
Lymphocytes Relative: 40 % (ref 12–46)
Lymphs Abs: 2.4 10*3/uL (ref 0.7–4.0)
MCH: 29.6 pg (ref 26.0–34.0)
MCHC: 32.3 g/dL (ref 30.0–36.0)
MCV: 91.6 fL (ref 78.0–100.0)
Monocytes Absolute: 0.6 10*3/uL (ref 0.1–1.0)
Monocytes Relative: 9 % (ref 3–12)
Neutro Abs: 2.9 10*3/uL (ref 1.7–7.7)
Neutrophils Relative %: 48 % (ref 43–77)
PLATELETS: 221 10*3/uL (ref 150–400)
RBC: 4.63 MIL/uL (ref 3.87–5.11)
RDW: 13.9 % (ref 11.5–15.5)
WBC: 6.1 10*3/uL (ref 4.0–10.5)

## 2015-03-19 LAB — BASIC METABOLIC PANEL
Anion gap: 9 (ref 5–15)
BUN: 24 mg/dL — AB (ref 6–23)
CALCIUM: 9.2 mg/dL (ref 8.4–10.5)
CHLORIDE: 107 mmol/L (ref 96–112)
CO2: 25 mmol/L (ref 19–32)
CREATININE: 0.78 mg/dL (ref 0.50–1.10)
GFR calc Af Amer: >90 mL/min (ref >90)
GLUCOSE: 100 mg/dL — AB (ref 70–99)
POTASSIUM: 3.9 mmol/L (ref 3.5–5.1)
Sodium: 141 mmol/L (ref 135–145)

## 2015-03-19 LAB — TROPONIN I: Troponin I: <0.03 ng/mL (ref <0.031)

## 2015-03-19 MED ORDER — METOPROLOL TARTRATE 50 MG PO TABS
25.0000 mg | ORAL_TABLET | Freq: Once | ORAL | Status: AC
  Administered 2015-03-19: 25 mg via ORAL

## 2015-03-19 MED ORDER — SODIUM CHLORIDE 0.9 % IV SOLN
Freq: Once | INTRAVENOUS | Status: AC
  Administered 2015-03-19: 01:00:00 via INTRAVENOUS

## 2015-03-19 MED ORDER — METOPROLOL TARTRATE 12.5 MG HALF TABLET
12.5000 mg | ORAL_TABLET | Freq: Once | ORAL | Status: DC
  Filled 2015-03-19: qty 1

## 2015-03-19 MED ORDER — METOPROLOL TARTRATE 50 MG PO TABS
ORAL_TABLET | ORAL | Status: AC
  Filled 2015-03-19: qty 1

## 2015-03-19 MED ORDER — METOPROLOL TARTRATE 1 MG/ML IV SOLN
2.5000 mg | Freq: Once | INTRAVENOUS | Status: AC
  Administered 2015-03-19: 2.5 mg via INTRAVENOUS
  Filled 2015-03-19: qty 5

## 2015-03-19 NOTE — ED Notes (Signed)
EKG done at bedside and given to [place] to review

## 2015-03-19 NOTE — ED Notes (Signed)
Reports hx of afib- states she is on flecinide and eliquis- States has been in afib x 1 hour

## 2015-03-19 NOTE — Discharge Instructions (Signed)

## 2015-03-19 NOTE — ED Provider Notes (Signed)
CSN: [id number]     Arrival date & time 03/19/15  0003 History  This chart was scribed for Jamariya Davidoff, MD by Edison Simon, ED Scribe. This patient was seen in room MH05/MH05 and the patient's care was started at 12:20 AM.    Chief Complaint  Patient presents with  . Irregular Heart Beat   Patient is a 49 y.o. female presenting with palpitations. The history is provided by the patient. No language interpreter was used.  Palpitations Palpitations quality:  Irregular Onset quality:  Sudden Duration:  1 hour Timing:  Constant Progression:  Unchanged Chronicity:  Recurrent Context: not stimulant use   Context comment:  Recent stress Relieved by:  Nothing Worsened by:  Nothing Ineffective treatments:  None tried Associated symptoms: no back pain, no chest pain, no dizziness, no leg pain, no shortness of breath and no syncope   Risk factors: hx of atrial fibrillation and stress   Risk factors: no hx of PE     HPI Comments: Akeela Busk is a 49 y.o. female with history of atrial fibrillation who presents to the Emergency Department complaining of irregular heartbeat with onset 1 hour ago, constant since then. She is prescribed 50mg  Flecainide and 5mg  BID Eliquis by cardiologist, Dr. Donnetta Hutching, and has not had atrial fibrillation until recently. She states she had a stressful week because her dog is sick and reports having intermittent irregular rhythm and tachycardia in 90s. She denies missing any doses of medications, change in thyroid function, change in exercise regimen, new oral contraceptive, recent long travel, or fluid retention.   Past Medical History  Diagnosis Date  . Hypertension   . Pituitary tumor   . GERD (gastroesophageal reflux disease)   . Sleep apnea   . Trigeminal neuralgia   . A-fib    Past Surgical History  Procedure Laterality Date  . Cholecystectomy    . Breast surgery     History reviewed. No pertinent family history. History  Substance Use Topics  .  Smoking status: Never Smoker   . Smokeless tobacco: Never Used  . Alcohol Use: No   OB History    No data available     Review of Systems  Constitutional: Negative for fever.  Respiratory: Negative for shortness of breath.   Cardiovascular: Positive for palpitations. Negative for chest pain, leg swelling and syncope.  Musculoskeletal: Negative for back pain.  Neurological: Negative for dizziness.  All other systems reviewed and are negative.     Allergies  Codeine and Dilaudid  Home Medications   Prior to Admission medications   Medication Sig Start Date End Date Taking? Authorizing Provider  amLODipine (NORVASC) 2.5 MG tablet Take 2.5 mg by mouth daily.   Yes Historical Provider, MD  apixaban (ELIQUIS) 5 MG TABS tablet Take 1 tablet (5 mg total) by mouth 2 (two) times daily. 01/28/14  Yes Blanchie Dessert, MD  flecainide (TAMBOCOR) 50 MG tablet Take 50 mg by mouth 2 (two) times daily.   Yes Historical Provider, MD  gabapentin (NEURONTIN) 300 MG capsule Take 300 mg by mouth 3 (three) times daily.   Yes Historical Provider, MD  lisinopril-hydrochlorothiazide (PRINZIDE,ZESTORETIC) 10-12.5 MG per tablet Take 1 tablet by mouth daily.   Yes Historical Provider, MD  omeprazole (PRILOSEC) 40 MG capsule Take 40 mg by mouth daily.   Yes Historical Provider, MD  Vitamin D, Ergocalciferol, (DRISDOL) 50000 UNITS CAPS Take 50,000 Units by mouth every 7 (seven) days. On Mondays   Yes Historical Provider, MD  cabergoline [facility])  0.5 MG tablet Take 0.5 mg by mouth.    Historical Provider, MD  cyclobenzaprine (FLEXERIL) 10 MG tablet Take 1 tablet (10 mg total) by mouth 2 (two) times daily as needed for muscle spasms. 05/07/14   Ankit Kathrynn Humble, MD  mometasone (NASONEX) 50 MCG/ACT nasal spray Place 2 sprays into the nose daily.    Historical Provider, MD  oxyCODONE-acetaminophen (PERCOCET/ROXICET) 5-325 MG per tablet Take 1 tablet by mouth every 4 (four) hours as needed for moderate pain or severe  pain. 05/07/14   Ankit Nanavati, MD   BP 137/83 mmHg  Pulse 95  Temp(Src) 99.1 F (37.3 C) (Oral)  Resp 22  Ht 5\' 5"  (1.651 m)  Wt 386 lb (175.088 kg)  BMI 64.23 kg/m2  SpO2 100% Physical Exam  Constitutional: She is oriented to person, place, and time. She appears well-developed and well-nourished.  HENT:  Head: Normocephalic and atraumatic.  Eyes: Conjunctivae and EOM are normal. Pupils are equal, round, and reactive to light.  Neck: Normal range of motion. Neck supple.  Cardiovascular:  irregular and tachycardic at 95  Pulmonary/Chest: Effort normal and breath sounds normal. No respiratory distress. She has no wheezes. She has no rales.  Abdominal: Bowel sounds are normal.  Musculoskeletal: Normal range of motion.  Neurological: She is alert and oriented to person, place, and time. She has normal reflexes.  Skin: Skin is warm and dry.  Psychiatric: She has a normal mood and affect.  Nursing note and vitals reviewed.   ED Course  Procedures (including critical care time)  DIAGNOSTIC STUDIES: Oxygen Saturation is 100% on room air, normal by my interpretation.    COORDINATION OF CARE: 12:26 AM Discussed treatment plan with patient at beside, the patient agrees with the plan and has no further questions at this time.   Labs Review Labs Reviewed  PREGNANCY, URINE  CBC WITH DIFFERENTIAL/PLATELET  BASIC METABOLIC PANEL  TROPONIN I    Imaging Review No results found.   EKG Interpretation   Date/Time:  Sunday Calissa Swenor 10 2016 00:09:24 EDT Ventricular Rate:  100 PR Interval:    QRS Duration: 94 QT Interval:  354 QTC Calculation: 456 R Axis:   20 Text Interpretation:  Atrial fibrillation Confirmed by [facility]  MD,  Emmaline Kluver ([id number]) on 03/19/2015 12:16:12 AM      MDM   Final diagnoses:  A-fib    Results for orders placed or performed during the hospital encounter of 03/19/15  Pregnancy, urine  Result Value Ref Range   Preg Test, Ur NEGATIVE NEGATIVE  CBC  with Differential/Platelet  Result Value Ref Range   WBC 6.1 4.0 - 10.5 K/uL   RBC 4.63 3.87 - 5.11 MIL/uL   Hemoglobin 13.7 12.0 - 15.0 g/dL   HCT 42.4 36.0 - 46.0 %   MCV 91.6 78.0 - 100.0 fL   MCH 29.6 26.0 - 34.0 pg   MCHC 32.3 30.0 - 36.0 g/dL   RDW 13.9 11.5 - 15.5 %   Platelets 221 150 - 400 K/uL   Neutrophils Relative % 48 43 - 77 %   Neutro Abs 2.9 1.7 - 7.7 K/uL   Lymphocytes Relative 40 12 - 46 %   Lymphs Abs 2.4 0.7 - 4.0 K/uL   Monocytes Relative 9 3 - 12 %   Monocytes Absolute 0.6 0.1 - 1.0 K/uL   Eosinophils Relative 2 0 - 5 %   Eosinophils Absolute 0.1 0.0 - 0.7 K/uL   Basophils Relative 1 0 - 1 %   Basophils  Absolute 0.0 0.0 - 0.1 K/uL  Basic metabolic panel  Result Value Ref Range   Sodium 141 135 - 145 mmol/L   Potassium 3.9 3.5 - 5.1 mmol/L   Chloride 107 96 - 112 mmol/L   CO2 25 19 - 32 mmol/L   Glucose, Bld 100 (H) 70 - 99 mg/dL   BUN 24 (H) 6 - 23 mg/dL   Creatinine, Ser 0.78 0.50 - 1.10 mg/dL   Calcium 9.2 8.4 - 10.5 mg/dL   GFR calc non Af Amer >90 >90 mL/min   GFR calc Af Amer >90 >90 mL/min   Anion gap 9 5 - 15  Troponin I  Result Value Ref Range   Troponin I <0.03 <0.031 ng/mL   Dg Chest 2 View  03/19/2015   CLINICAL DATA:  Irregular heart beat. Intermittent irregular rhythm and tachycardia.  EXAM: CHEST  2 VIEW  COMPARISON:  05/07/2014  FINDINGS: Linear atelectasis in the right lung base. Normal heart size and pulmonary vascularity. No focal airspace disease or consolidation in the lungs. No blunting of costophrenic angles. No pneumothorax. Mediastinal contours appear intact. Mild degenerative changes in the spine.  IMPRESSION: Linear atelectasis in the right lung base similar to prior study. No evidence of active pulmonary disease.   Electronically Signed   By: Lucienne Capers M.D.   On: 03/19/2015 01:43     Medications  0.9 %  sodium chloride infusion ( Intravenous Stopped 03/19/15 0158)  metoprolol (LOPRESSOR) injection 2.5 mg (2.5 mg  Intravenous Given 03/19/15 0203)  metoprolol (LOPRESSOR) 50 MG tablet (  Duplicate 7/67/20 9470)  metoprolol (LOPRESSOR) tablet 25 mg (25 mg Oral Given 03/19/15 0355)   HR in the 70s in AFIB at d/c Patient instructed to d/c lisinopril increase metoprolol and call her cardiologist in am.  Return for any concerns.  Patient verbalizes understanding  I personally performed the services described in this documentation, which was scribed in my presence. The recorded information has been reviewed and is accurate.    Veatrice Kells, MD 03/19/15 [phone]

## 2015-09-29 ENCOUNTER — Encounter (HOSPITAL_BASED_OUTPATIENT_CLINIC_OR_DEPARTMENT_OTHER): Payer: Self-pay

## 2015-09-29 ENCOUNTER — Emergency Department (HOSPITAL_BASED_OUTPATIENT_CLINIC_OR_DEPARTMENT_OTHER)
Admission: EM | Admit: 2015-09-29 | Discharge: 2015-09-29 | Disposition: A | Discharge status: Home / Self Care | Payer: BLUE CROSS/BLUE SHIELD | Attending: Emergency Medicine | Admitting: Emergency Medicine

## 2015-09-29 ENCOUNTER — Emergency Department (HOSPITAL_BASED_OUTPATIENT_CLINIC_OR_DEPARTMENT_OTHER): Payer: BLUE CROSS/BLUE SHIELD

## 2015-09-29 DIAGNOSIS — Z8669 Personal history of other diseases of the nervous system and sense organs: Secondary | ICD-10-CM | POA: Diagnosis not present

## 2015-09-29 DIAGNOSIS — I10 Essential (primary) hypertension: Secondary | ICD-10-CM | POA: Diagnosis not present

## 2015-09-29 DIAGNOSIS — M79671 Pain in right foot: Secondary | ICD-10-CM | POA: Diagnosis present

## 2015-09-29 DIAGNOSIS — K219 Gastro-esophageal reflux disease without esophagitis: Secondary | ICD-10-CM | POA: Diagnosis not present

## 2015-09-29 DIAGNOSIS — Z8603 Personal history of neoplasm of uncertain behavior: Secondary | ICD-10-CM | POA: Insufficient documentation

## 2015-09-29 DIAGNOSIS — Z79899 Other long term (current) drug therapy: Secondary | ICD-10-CM | POA: Diagnosis not present

## 2015-09-29 DIAGNOSIS — M79674 Pain in right toe(s): Secondary | ICD-10-CM | POA: Diagnosis not present

## 2015-09-29 LAB — BASIC METABOLIC PANEL
ANION GAP: 8 (ref 5–15)
BUN: 22 mg/dL — AB (ref 6–20)
CO2: 28 mmol/L (ref 22–32)
Calcium: 9.2 mg/dL (ref 8.9–10.3)
Chloride: 107 mmol/L (ref 101–111)
Creatinine, Ser: 0.69 mg/dL (ref 0.44–1.00)
GFR calc Af Amer: >60 mL/min (ref >60)
GLUCOSE: 89 mg/dL (ref 65–99)
POTASSIUM: 4 mmol/L (ref 3.5–5.1)
Sodium: 143 mmol/L (ref 135–145)

## 2015-09-29 LAB — CBC WITH DIFFERENTIAL/PLATELET
Basophils Absolute: 0 10*3/uL (ref 0.0–0.1)
Basophils Relative: 1 %
EOS PCT: 1 %
Eosinophils Absolute: 0 10*3/uL (ref 0.0–0.7)
HEMATOCRIT: 40.9 % (ref 36.0–46.0)
Hemoglobin: 13.3 g/dL (ref 12.0–15.0)
LYMPHS ABS: 1.4 10*3/uL (ref 0.7–4.0)
LYMPHS PCT: 30 %
MCH: 29.8 pg (ref 26.0–34.0)
MCHC: 32.5 g/dL (ref 30.0–36.0)
MCV: 91.5 fL (ref 78.0–100.0)
MONO ABS: 0.5 10*3/uL (ref 0.1–1.0)
Monocytes Relative: 10 %
NEUTROS ABS: 2.8 10*3/uL (ref 1.7–7.7)
Neutrophils Relative %: 58 %
PLATELETS: 183 10*3/uL (ref 150–400)
RBC: 4.47 MIL/uL (ref 3.87–5.11)
RDW: 14 % (ref 11.5–15.5)
WBC: 4.7 10*3/uL (ref 4.0–10.5)

## 2015-09-29 LAB — URIC ACID: Uric Acid, Serum: 5.8 mg/dL (ref 2.3–6.6)

## 2015-09-29 MED ORDER — PREDNISONE 10 MG PO TABS: 20.0000 mg | ORAL_TABLET | Freq: Every day | ORAL | Status: DC

## 2015-09-29 NOTE — Discharge Instructions (Signed)

## 2015-09-29 NOTE — ED Notes (Signed)
Foot pain and swelling to ball of right foot worsening since Wednesday, no known injury.

## 2015-09-29 NOTE — ED Provider Notes (Signed)
CSN: [id number]     Arrival date & time 09/29/15  1245 History   First MD Initiated Contact with Patient 09/29/15 [phone]     Chief Complaint  Patient presents with  . Foot Pain      HPI Patient presents with pain at the base of the right great toe primarily on the plantar surface.  Been there for 24 hours.  No known injury.  Hurts when she has passive or active flexion of the toe.  She notices some mild swelling.  No history of gout. Past Medical History  Diagnosis Date  . Hypertension   . Pituitary tumor ([place])   . GERD (gastroesophageal reflux disease)   . Sleep apnea   . Trigeminal neuralgia   . A-fib [facility])    Past Surgical History  Procedure Laterality Date  . Cholecystectomy    . Breast surgery     No family history on file. Social History  Substance Use Topics  . Smoking status: Never Smoker   . Smokeless tobacco: Never Used  . Alcohol Use: No   OB History    No data available     Review of Systems  All other systems reviewed and are negative  Allergies  Codeine; Dilaudid; and Morphine and related  Home Medications   Prior to Admission medications   Medication Sig Start Date End Date Taking? Authorizing Provider  amLODipine (NORVASC) 2.5 MG tablet Take 2.5 mg by mouth daily.    Historical Provider, MD  apixaban (ELIQUIS) 5 MG TABS tablet Take 1 tablet (5 mg total) by mouth 2 (two) times daily. 01/28/14   Blanchie Dessert, MD  cabergoline (DOSTINEX) 0.5 MG tablet Take 0.5 mg by mouth.    Historical Provider, MD  cyclobenzaprine (FLEXERIL) 10 MG tablet Take 1 tablet (10 mg total) by mouth 2 (two) times daily as needed for muscle spasms. 05/07/14   Varney Biles, MD  flecainide (TAMBOCOR) 50 MG tablet Take 50 mg by mouth 2 (two) times daily.    Historical Provider, MD  gabapentin (NEURONTIN) 300 MG capsule Take 300 mg by mouth 3 (three) times daily.    Historical Provider, MD  lisinopril-hydrochlorothiazide (PRINZIDE,ZESTORETIC) 10-12.5 MG per tablet Take 1 tablet  by mouth daily.    Historical Provider, MD  mometasone (NASONEX) 50 MCG/ACT nasal spray Place 2 sprays into the nose daily.    Historical Provider, MD  omeprazole (PRILOSEC) 40 MG capsule Take 40 mg by mouth daily.    Historical Provider, MD  oxyCODONE-acetaminophen (PERCOCET/ROXICET) 5-325 MG per tablet Take 1 tablet by mouth every 4 (four) hours as needed for moderate pain or severe pain. 05/07/14   Varney Biles, MD  predniSONE (DELTASONE) 10 MG tablet Take 2 tablets (20 mg total) by mouth daily. 09/29/15   Leonard Schwartz, MD  Vitamin D, Ergocalciferol, (DRISDOL) 50000 UNITS CAPS Take 50,000 Units by mouth every 7 (seven) days. On Mondays    Historical Provider, MD   BP 144/77 mmHg  Pulse 50  Temp(Src) 98.5 F (36.9 C) (Oral)  Resp 20  Ht 5\' 5"  (1.651 m)  Wt 352 lb (159.666 kg)  BMI 58.58 kg/m2  SpO2 100%  LMP 07/30/2015 Physical Exam Physical Exam  Nursing note and vitals reviewed. Constitutional: She is oriented to person, place, and time. She appears well-developed and well-nourished. No distress.  HENT:  Head: Normocephalic and atraumatic.  Eyes: Pupils are equal, round, and reactive to light.  Neck: Normal range of motion.  Cardiovascular: Normal rate and intact distal pulses.  Pulmonary/Chest: No respiratory distress.  Abdominal: Normal appearance. She exhibits no distension.  Musculoskeletal: Normal range of motion.  Tenderness and pain at the base of the right great toe.  Primarily on the plantar aspect.  Some mild swelling but no erythema or heat.   Neurological: She is alert and oriented to person, place, and time. No cranial nerve deficit.  Skin: Skin is warm and dry. No rash noted.  Psychiatric: She has a normal mood and affect. Her behavior is normal.   ED Course  Procedures (including critical care time) Labs Review Labs Reviewed  BASIC METABOLIC PANEL - Abnormal; Notable for the following:    BUN 22 (*)    All other components within normal limits  URIC ACID   CBC WITH DIFFERENTIAL/PLATELET    Imaging Review Dg Foot Complete Right  09/29/2015  CLINICAL DATA:  Acute right great toe pain.  Swelling. EXAM: RIGHT FOOT COMPLETE - 3+ VIEW COMPARISON:  None. FINDINGS: There small erosions of the first metatarsal head and of base of the proximal phalanx of the great toe. There is a hallux valgus deformity with slight bunion formation. There is prominent soft tissue swelling over the medial aspect of the joint. The patient has slight arthritis of first through fourth tarsal metatarsal joints. The other bones appear normal. 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 Electronically Signed   By: Lorriane Shire M.D.   On: 09/29/2015 07:32   I have personally reviewed and evaluated these images and lab results as part of my medical decision-making.    MDM   Final diagnoses:  Great toe pain, right        Leonard Schwartz, MD 09/29/15 [phone]

## 2015-09-29 NOTE — ED Notes (Signed)
Attempt blood draw to left ac unsuccessful.

## 2016-04-08 ENCOUNTER — Emergency Department (HOSPITAL_COMMUNITY)
Admission: EM | Admit: 2016-04-08 | Discharge: 2016-04-08 | Discharge status: Against Medical Advice | Payer: Managed Care, Other (non HMO)

## 2016-04-08 NOTE — ED Notes (Signed)
Patient name called x 2. No answer.

## 2018-03-02 ENCOUNTER — Ambulatory Visit: Payer: Self-pay | Admitting: Emergency Medicine

## 2018-03-02 ENCOUNTER — Ambulatory Visit (HOSPITAL_BASED_OUTPATIENT_CLINIC_OR_DEPARTMENT_OTHER)
Admission: RE | Admit: 2018-03-02 | Discharge: 2018-03-02 | Disposition: A | Discharge status: Home / Self Care | Payer: BLUE CROSS/BLUE SHIELD | Source: Ambulatory Visit | Attending: Emergency Medicine | Admitting: Emergency Medicine

## 2018-03-02 VITALS — BP 128/78 | HR 68 | Temp 98.4°F | Resp 18

## 2018-03-02 DIAGNOSIS — M79642 Pain in left hand: Secondary | ICD-10-CM | POA: Diagnosis not present

## 2018-03-02 NOTE — Patient Instructions (Signed)
Scaphoid Fracture A scaphoid fracture is a break in one of the small bones of the wrist. The scaphoid bone is located on the thumbside of the wrist. Itsupports the other seven bones that make up the wrist. The scaphoid bone has a poor blood supply, so it can take a long time to heal. You may need to wear a cast or splint for several months. What are the causes? This injury is usually caused by a fall onto an outstretched hand and arm. This type of injury may also occur if you are in a motor vehicle collisionand you brace yourself with your hand. What increases the risk? The following factors may make you more likely to develop this injury:  Playingcontact sports.  Skiing, skating, or rollerblading.  What are the signs or symptoms? Symptoms of this injury include:  Pain, especially when grasping or pinching with your thumb.  Pain when pressing on the base of your thumb, especially in the hollow area at the base of your thumb when your thumb is extended outward.  Swelling.  Bruising.  How is this diagnosed? This injury may be diagnosed based on:  Your history of injury.  A physical exam of your wrist and thumb.  X-rays.  CT scan or MRI. These tests are sometimes needed because this type of fracture may not show up on X-rays.  A scaphoid fracture may be hard to diagnose because pain may not start for a few days. Also, the fracture does not cause a deformity, and it may not limit movement. How is this treated? Treatment depends on the location of the fracture and whether the bone is out of place (displaced). Treatment may be surgical or nonsurgical:  You may need a cast or splint from the middle of your forearm down to your wrist. Yourthumb may be extended out and included in the cast or splint.  While your fracture is healing, it may be treated with sound waves or electricalenergy to stimulate healing.  A displaced fracture may require surgery to put the pieces of bone  back in proper position. Screws or wires may be used to hold the bone in place.  You may need to do exercises (physical therapy) to restore wrist movement after your cast or splint is removed.  Follow these instructions at home: If you have a cast:  Do not stick anything inside the cast to scratch your skin. Doing that increases your risk of infection.  Check the skin around the cast every day. Report any concerns to your health care provider. You may put lotion on dry skin around the edges of the cast. Do not apply lotion to the skin underneath the cast.  Do not let your cast get wet if it is not waterproof.  Keep the cast clean. If you have a splint:  Wear the splint as told by your health care provider. Remove it only as told by your health care provider.  Loosen the splint if your fingers tingle, become numb, or turn cold and blue.  Do not let your splint get wet if it is not waterproof.  Keep the splint clean. Bathing  Do not take baths, swim, or use a hot tub until your health care provider approves. Ask your health care provider if you can take showers. You may only be allowed to take sponge baths for bathing.  If your cast or splint is not waterproof, cover it with a watertight plastic bag when you take a bath or a shower. Managing  pain, stiffness, and swelling  If directed, apply ice to the injured area. ? Put ice in a plastic bag. ? Place a towel between your skin and the bag. ? Leave the ice on for 20 minutes, 2-3 times per day.  Move your fingers often to avoid stiffness and to lessen swelling.  Raise (elevate) the injured area above the level of your heart while you are sitting or lying down. Driving  Do not drive or operate heavy machinery while taking prescription pain medicine.  Ask your health care provider when it is safe to drive if you have a cast or splint on a hand that you use for driving. Activity  Return to your normal activities as told by your  health care provider. Ask your health care provider what activities are safe for you. You may need to limit activities such as contact sports, throwing, pushing, climbing, and usingvibrating machinery.  Do not lift anything that is heavier than 1 lb (0.5 kg) with the affected hand until your health care provider tells you that it is safe.  Do exercises only as told by your health care provider. General instructions  Do not put pressure on any part of the cast or splint until it is fully hardened. This may take several hours.  Take over-the-counter and prescription medicines only as told by your health care provider.  Do not use any tobacco products, including cigarettes, chewing tobacco, or e-cigarettes. Tobacco can delay bone healing. If you need help quitting, ask your health care provider.  Keep all follow-up visits as told by your health care provider. This is important. Contact a health care provider if:  Your pain or swelling gets worse even though you have had treatment.  You have pain, numbness, or coldness in your hand or fingers.  Your cast or splint becomes loose or damaged. Get help right away if:  You lose feeling in your hand or fingers.  Your fingers or fingernails turn pale or blue. This information is not intended to replace advice given to you by your health care provider. Make sure you discuss any questions you have with your health care provider. Document Released: 11/15/2002 Document Revised: 05/02/2016 Document Reviewed: 06/07/2015 Elsevier Interactive Patient Education  2018 [place] for Routine Care of Injuries Many injuries can be cared for using rest, ice, compression, and elevation (RICE therapy). Using RICE therapy can help to lessen pain and swelling. It can help your body to heal. Rest Reduce your normal activities and avoid using the injured part of your body. You can go back to your normal activities when you feel okay and your doctor says  it is okay. Ice Do not put ice on your bare skin.  Put ice in a plastic bag.  Place a towel between your skin and the bag.  Leave the ice on for 20 minutes, 2-3 times a day.  Do this for as long as told by your doctor. Compression Compression means putting pressure on the injured area. This can be done with an elastic bandage. If an elastic bandage has been applied:  Remove and reapply the bandage every 3-4 hours or as told by your doctor.  Make sure the bandage is not wrapped too tight. Wrap the bandage more loosely if part of your body beyond the bandage is blue, swollen, cold, painful, or loses feeling (numb).  See your doctor if the bandage seems to make your problems worse.  Elevation Elevation means keeping the injured area raised. Raise  the injured area above your heart or the center of your chest if you can. When should I get help? You should get help if:  You keep having pain and swelling.  Your symptoms get worse.  Get help right away if: You should get help right away if:  You have sudden bad pain at or below the area of your injury.  You have redness or more swelling around your injury.  You have tingling or numbness at or below the injury that does not go away when you take off the bandage.  This information is not intended to replace advice given to you by your health care provider. Make sure you discuss any questions you have with your health care provider. Document Released: 05/13/2008 Document Revised: 10/22/2016 Document Reviewed: 11/02/2014 Elsevier Interactive Patient Education  2017 [employer].

## 2018-03-02 NOTE — Progress Notes (Signed)
S: Teresa Sexton is a 52 y.o. female who presents for evaluation of left hand pain. She reports she fell two days ago on outstretched hands, left hand first into a door. She had no LOC, no dizziness, no headache, remembers all the events around the fall. She is right hand dominate, works in administration doing typing and office work. Pain has worsened over pervious two days, with complaint of weakness and swelling. Otherwise reports to be in good health.  O:  Vitals:   03/02/18 1153  BP: 128/78  Pulse: 68  Resp: 18  Temp: 98.4 F (36.9 C)  SpO2: 99%   Constitutional: A&O, NAD Skin: intact without rash or hamartoma Right hand exam normal  Left hand: decreased grip strength due to pain, able to flex and extend the fingers, no pain with range of motion, sensation intact at radial, medial, and ulnar nerve, point tenderness to palpation at base of the left thumb. capillary refill less than 2 seconds    A:  1. Left hand pain     P: Likely hood of fracture is low, however there is point tenderness at the anatatomic snuff box. Will order a thumb spica, send for x-rays, and referral to hand for routine follow up. Currently taking Celebrex for knee pain, continue this medication, may take Tylenol as well. Will contact patient with x-ray results as they become available.

## 2019-11-07 ENCOUNTER — Emergency Department (HOSPITAL_BASED_OUTPATIENT_CLINIC_OR_DEPARTMENT_OTHER)
Admission: EM | Admit: 2019-11-07 | Discharge: 2019-11-07 | Disposition: A | Discharge status: Home / Self Care | Payer: BC Managed Care – PPO | Attending: Emergency Medicine | Admitting: Emergency Medicine

## 2019-11-07 ENCOUNTER — Encounter (HOSPITAL_BASED_OUTPATIENT_CLINIC_OR_DEPARTMENT_OTHER): Payer: Self-pay | Admitting: Emergency Medicine

## 2019-11-07 ENCOUNTER — Other Ambulatory Visit: Payer: Self-pay

## 2019-11-07 DIAGNOSIS — Z20828 Contact with and (suspected) exposure to other viral communicable diseases: Secondary | ICD-10-CM | POA: Diagnosis not present

## 2019-11-07 DIAGNOSIS — Z885 Allergy status to narcotic agent status: Secondary | ICD-10-CM | POA: Diagnosis not present

## 2019-11-07 DIAGNOSIS — R350 Frequency of micturition: Secondary | ICD-10-CM

## 2019-11-07 DIAGNOSIS — I4891 Unspecified atrial fibrillation: Secondary | ICD-10-CM | POA: Insufficient documentation

## 2019-11-07 DIAGNOSIS — R519 Headache, unspecified: Secondary | ICD-10-CM | POA: Insufficient documentation

## 2019-11-07 DIAGNOSIS — I10 Essential (primary) hypertension: Secondary | ICD-10-CM | POA: Insufficient documentation

## 2019-11-07 DIAGNOSIS — Z79899 Other long term (current) drug therapy: Secondary | ICD-10-CM | POA: Insufficient documentation

## 2019-11-07 DIAGNOSIS — Z20822 Contact with and (suspected) exposure to covid-19: Secondary | ICD-10-CM

## 2019-11-07 DIAGNOSIS — R300 Dysuria: Secondary | ICD-10-CM | POA: Diagnosis present

## 2019-11-07 LAB — URINALYSIS, ROUTINE W REFLEX MICROSCOPIC
Bilirubin Urine: NEGATIVE
Glucose, UA: NEGATIVE mg/dL
Ketones, ur: NEGATIVE mg/dL
Nitrite: NEGATIVE
Protein, ur: NEGATIVE mg/dL
Specific Gravity, Urine: 1.02 (ref 1.005–1.030)
pH: 6.5 (ref 5.0–8.0)

## 2019-11-07 LAB — CBG MONITORING, ED: Glucose-Capillary: 72 mg/dL (ref 70–99)

## 2019-11-07 LAB — URINALYSIS, MICROSCOPIC (REFLEX)

## 2019-11-07 MED ORDER — CEPHALEXIN 500 MG PO CAPS: 500.0000 mg | ORAL_CAPSULE | Freq: Four times a day (QID) | ORAL | 0 refills | Status: AC

## 2019-11-07 MED ORDER — PHENAZOPYRIDINE HCL 200 MG PO TABS
200.0000 mg | ORAL_TABLET | Freq: Three times a day (TID) | ORAL | 0 refills | Status: AC
Start: 2019-11-07 — End: ?

## 2019-11-07 NOTE — Discharge Instructions (Addendum)
You were seen in the emergency department for urinary frequency and had a headache with concern of possible Covid exposure.  You had Covid testing done that was pending at time of discharge and you should isolate until your Covid tests are resulted in the next day or 2.  If your Covid test is positive you will need to isolate for 14 days.  You urine did not show an obvious urine infection but we are covering you with antibiotics and some medication to help with the spasm.  We sent a urine culture off that should result in the next couple of days.  Please follow-up with your doctor return if any worsening symptoms.

## 2019-11-07 NOTE — ED Provider Notes (Signed)
[facility]PART[facility] Provider Note   CSN: [id number] Arrival date & time: 11/07/19  1158     History   Chief Complaint Chief Complaint  Patient presents with  . Dysuria    also wants covid test    HPI Teresa Sexton is a 53 y.o. female.  She is presenting with urinary frequency that started about a week ago.  Not associate with any fever or chills.  No flank pain.  She is also had a headache posterior that started yesterday radiating through to her left eye.  No blurry vision or double vision.  She is here with her husband who was exposed to Covid positive person at work and also wants Covid testing.  She has minimal cough no shortness of breath no nausea vomiting diarrhea.  No numbness or weakness.  No neck stiffness.     The history is provided by the patient.  Female GU Problem This is a new problem. The current episode started more than 1 week ago. The problem occurs constantly. The problem has not changed since onset.Associated symptoms include headaches. Pertinent negatives include no chest pain, no abdominal pain and no shortness of breath. Nothing aggravates the symptoms. Nothing relieves the symptoms. She has tried nothing for the symptoms. The treatment provided no relief.    Past Medical History:  Diagnosis Date  . A-fib ([place])   . GERD (gastroesophageal reflux disease)   . Hypertension   . Pituitary tumor   . Sleep apnea   . Trigeminal neuralgia     There are no active problems to display for this patient.   Past Surgical History:  Procedure Laterality Date  . BREAST SURGERY    . CHOLECYSTECTOMY       OB History   No obstetric history on file.      Home Medications    Prior to Admission medications   Medication Sig Start Date End Date Taking? Authorizing Provider  amLODipine (NORVASC) 2.5 MG tablet Take 2.5 mg by mouth daily.    [provider]  apixaban (ELIQUIS) 5 MG TABS tablet Take 1 tablet (5 mg total) by mouth  2 (two) times daily. 01/28/14   Blanchie Dessert, MD  cabergoline (DOSTINEX) 0.5 MG tablet Take 0.5 mg by mouth.    [provider]  flecainide (TAMBOCOR) 50 MG tablet Take 50 mg by mouth 2 (two) times daily.    [provider]  gabapentin (NEURONTIN) 300 MG capsule Take 300 mg by mouth 3 (three) times daily.    [provider]  lisinopril-hydrochlorothiazide (PRINZIDE,ZESTORETIC) 10-12.5 MG per tablet Take 1 tablet by mouth daily.    [provider]  mometasone (NASONEX) 50 MCG/ACT nasal spray Place 2 sprays into the nose daily.    [provider]  omeprazole (PRILOSEC) 40 MG capsule Take 40 mg by mouth daily.    [provider]  Vitamin D, Ergocalciferol, (DRISDOL) 50000 UNITS CAPS Take 50,000 Units by mouth every 7 (seven) days. On Mondays    [provider]    Family History History reviewed. No pertinent family history.  Social History Social History   Tobacco Use  . Smoking status: Never Smoker  . Smokeless tobacco: Never Used  Substance Use Topics  . Alcohol use: No  . Drug use: No     Allergies   Codeine, Dilaudid [hydromorphone], and Morphine and related   Review of Systems Review of Systems  Constitutional: Negative for fever.  HENT: Negative for sore throat.  Eyes: Negative for visual disturbance.  Respiratory: Positive for cough. Negative for shortness of breath.   Cardiovascular: Negative for chest pain.  Gastrointestinal: Negative for abdominal pain.  Genitourinary: Positive for frequency. Negative for dysuria.  Musculoskeletal: Negative for neck pain.  Skin: Negative for rash.  Neurological: Positive for headaches. Negative for speech difficulty, weakness and numbness.     Physical Exam Updated Vital Signs BP (!) 186/62 (BP Location: Left Arm)   Pulse 60   Temp 99.1 F (37.3 C) (Oral)   Resp 18   Ht 5\' 6"  (1.676 m)   Wt (!) 199.6 kg   SpO2 100%   BMI 71.02 kg/m   Physical Exam  Vitals signs and nursing note reviewed.  Constitutional:      General: She is not in acute distress.    Appearance: She is well-developed.  HENT:     Head: Normocephalic and atraumatic.  Eyes:     Extraocular Movements: Extraocular movements intact.     Conjunctiva/sclera: Conjunctivae normal.     Pupils: Pupils are equal, round, and reactive to light.  Neck:     Musculoskeletal: Neck supple.  Cardiovascular:     Rate and Rhythm: Normal rate and regular rhythm.     Heart sounds: No murmur.  Pulmonary:     Effort: Pulmonary effort is normal. No respiratory distress.     Breath sounds: Normal breath sounds.  Abdominal:     Palpations: Abdomen is soft.     Tenderness: There is no abdominal tenderness.  Skin:    General: Skin is warm and dry.     Capillary Refill: Capillary refill takes less than 2 seconds.  Neurological:     General: No focal deficit present.     Mental Status: She is alert and oriented to person, place, and time.     Gait: Gait normal.      ED Treatments / Results  Labs (all labs ordered are listed, but only abnormal results are displayed) Labs Reviewed  URINALYSIS, ROUTINE W REFLEX MICROSCOPIC - Abnormal; Notable for the following components:      Result Value   APPearance CLOUDY (*)    Hgb urine dipstick SMALL (*)    Leukocytes,Ua TRACE (*)    All other components within normal limits  URINALYSIS, MICROSCOPIC (REFLEX) - Abnormal; Notable for the following components:   Bacteria, UA MANY (*)    All other components within normal limits  NOVEL CORONAVIRUS, NAA (HOSP ORDER, SEND-OUT TO REF LAB; TAT 18-24 HRS)  URINE CULTURE  CBG MONITORING, ED    EKG None  Radiology No results found.  Procedures Procedures (including critical care time)  Medications Ordered in ED Medications - No data to display   Initial Impression / Assessment and Plan / ED Course  I have reviewed the triage vital signs and the nursing notes.  Pertinent labs & imaging  results that were available during my care of the patient were reviewed by me and considered in my medical decision making (see chart for details).    Teresa Sexton was evaluated in Emergency Department on 11/07/2019 for the symptoms described in the history of present illness. She was evaluated in the context of the global COVID-19 pandemic, which necessitated consideration that the patient might be at risk for infection with the SARS-CoV-2 virus that causes COVID-19. Institutional protocols and algorithms that pertain to the evaluation of patients at risk for COVID-19 are in a state of rapid change based on information released by regulatory bodies including  the [employer] and federal and state organizations. These policies and algorithms were followed during the patient's care in the ED.      Final Clinical Impressions(s) / ED Diagnoses   Final diagnoses:  Urinary frequency  Person under investigation for COVID-19    ED Discharge Orders         Ordered    cephALEXin (KEFLEX) 500 MG capsule  4 times daily     11/07/19 1415    phenazopyridine (PYRIDIUM) 200 MG tablet  3 times daily     11/07/19 1415           Hayden Rasmussen, MD 11/07/19 1715

## 2019-11-07 NOTE — ED Triage Notes (Signed)
Patient states that she has had urinary frequency starting about 1 - 2 hours that started about 1 weeks ago - the patient also states that she is having a headache that is not normal for her. The patient states that her husband had close contact with someone with who was COVID +

## 2019-11-08 LAB — URINE CULTURE

## 2019-11-08 LAB — NOVEL CORONAVIRUS, NAA (HOSP ORDER, SEND-OUT TO REF LAB; TAT 18-24 HRS): SARS-CoV-2, NAA: NOT DETECTED

## 2020-03-02 IMAGING — DX DG HAND COMPLETE 3+V*L*
3 series · 3 of 3 positions shown · non-contrast
Comparison: None.

CLINICAL DATA: Hit hand and wrist on a door frame 5 days ago with
pain and swelling near the first MCP joint

EXAM:
LEFT HAND - COMPLETE 3+ VIEW

[hand pa]
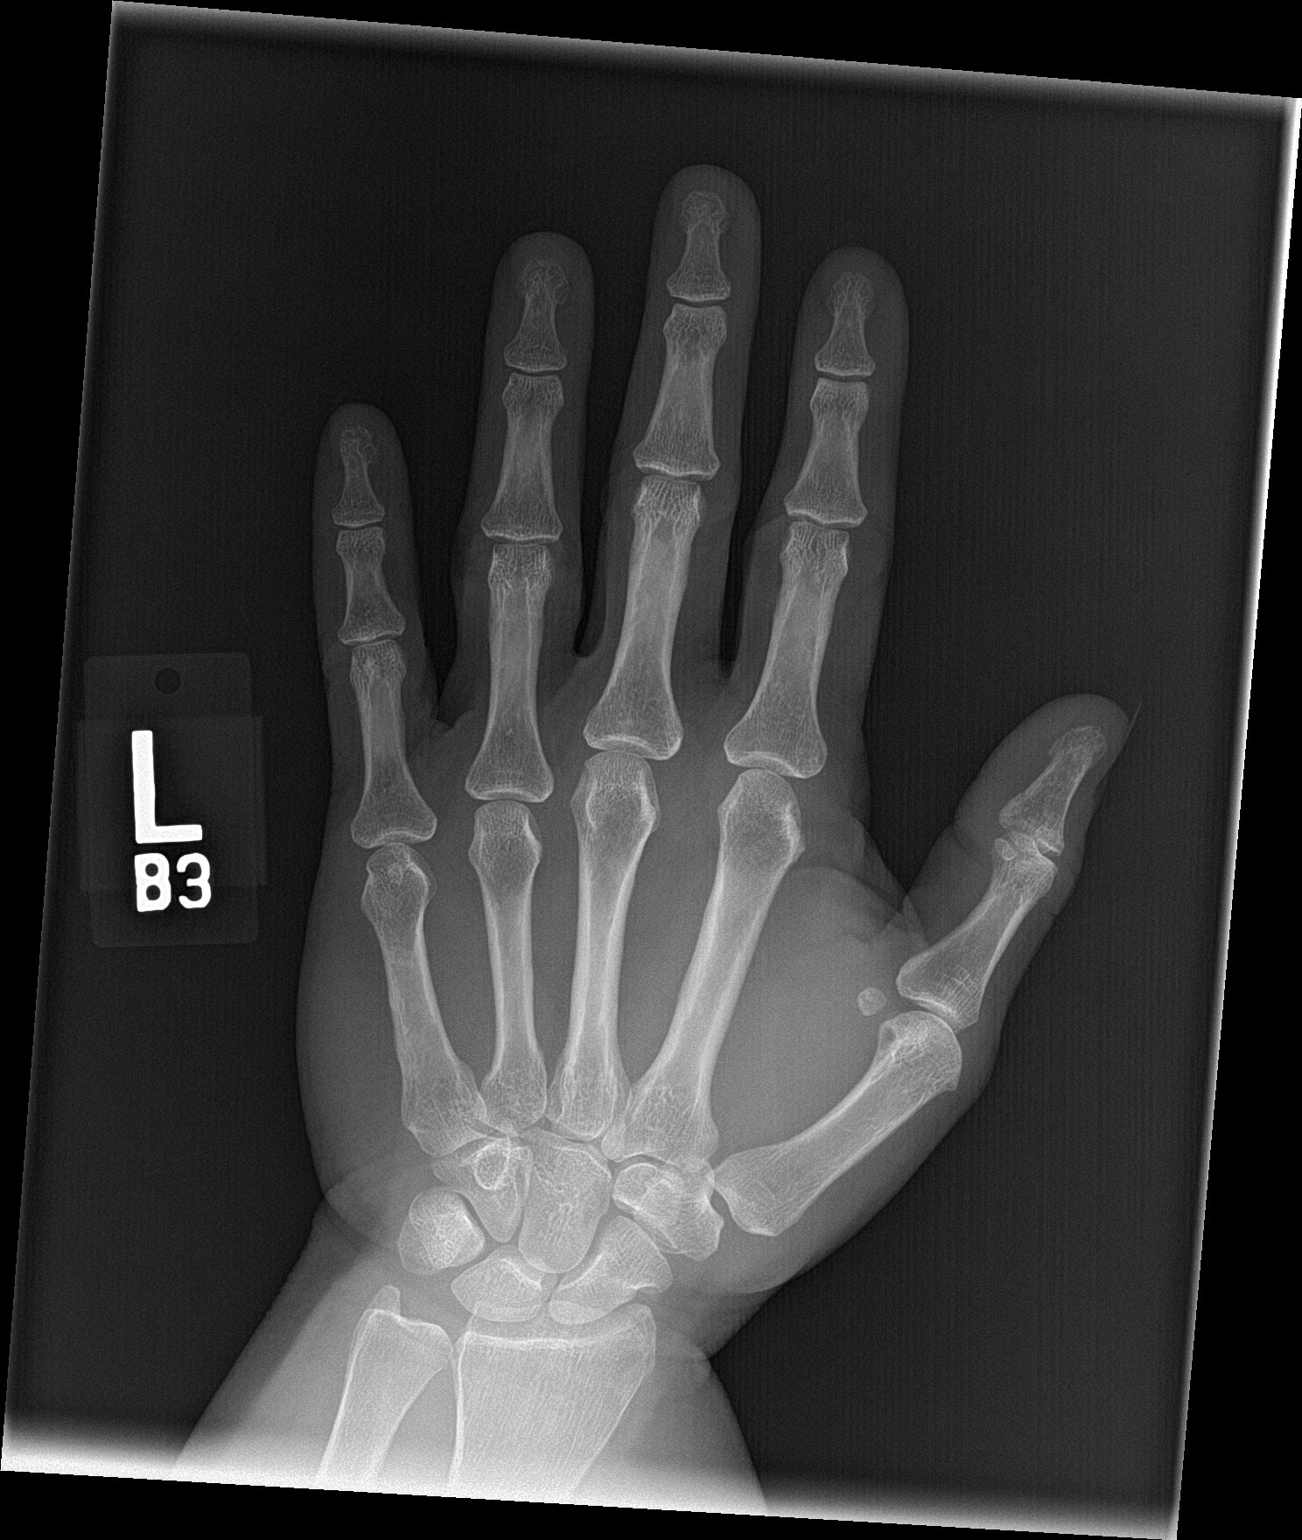

[hand obl]
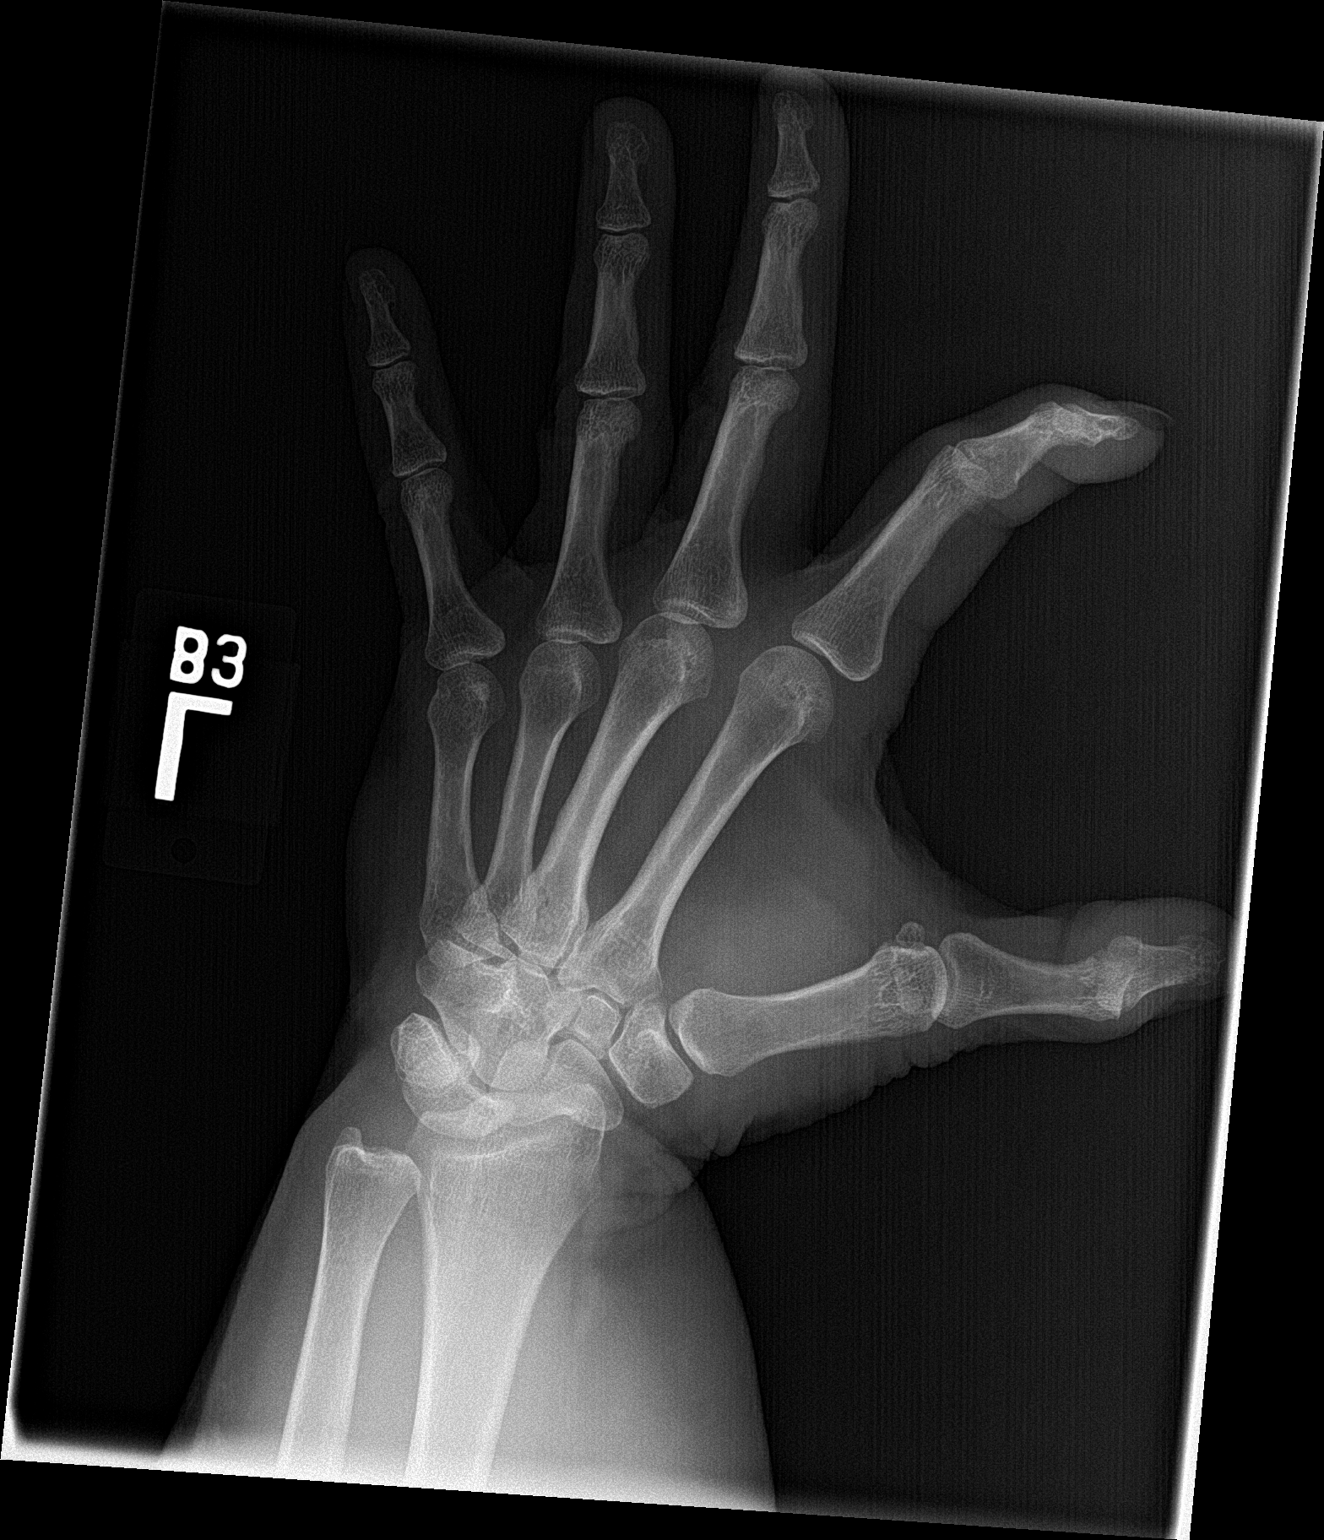

[hand lat]
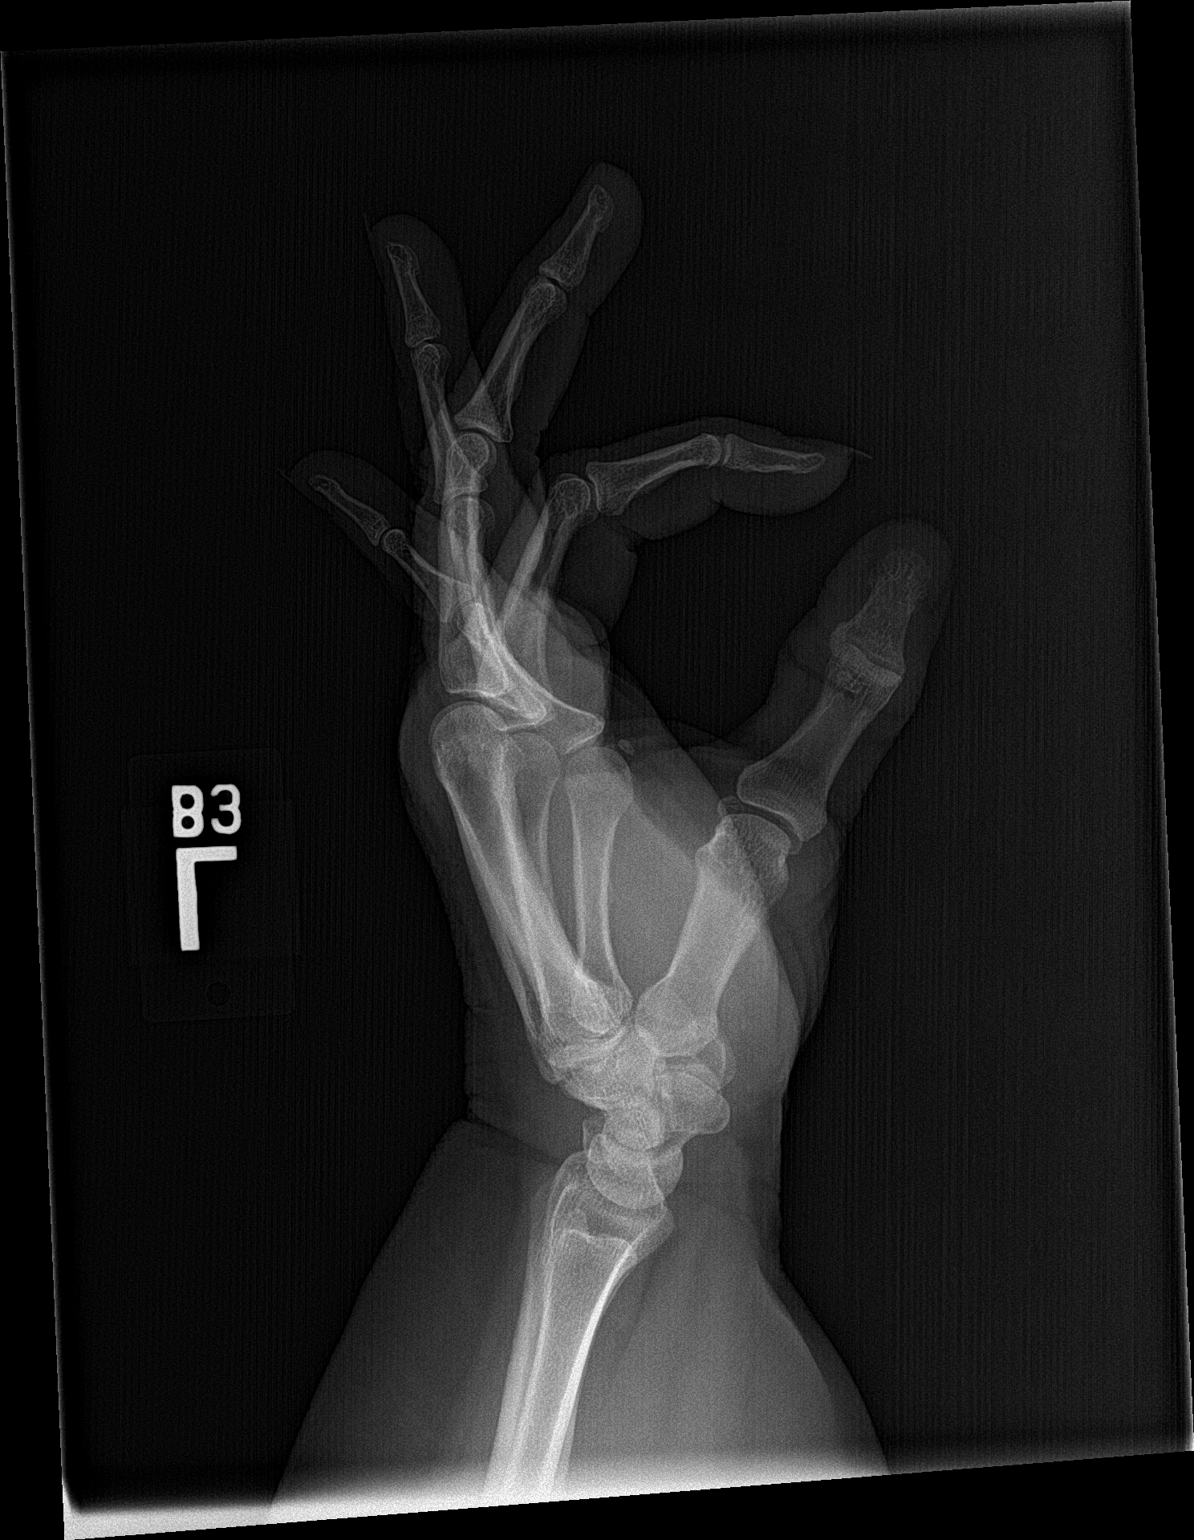

[3 of 3 positions shown; findings below may reference images not displayed]

FINDINGS: The left radiocarpal joint space appears normal. The ulnar styloid
is intact and the carpal bones are in normal position. MCP, PIP, and
DIP joints appear normal. No erosion is seen. No fracture is noted.
IMPRESSION: Negative.

## 2020-03-02 IMAGING — DX DG WRIST COMPLETE 3+V*L*
4 series · 4 of 4 positions shown · non-contrast
Comparison: None.

CLINICAL DATA: Hit hand and wrist on door frame 5 days ago with
pain and swelling near the first MCP joint

EXAM:
LEFT WRIST - COMPLETE 3+ VIEW

[wrist pa]
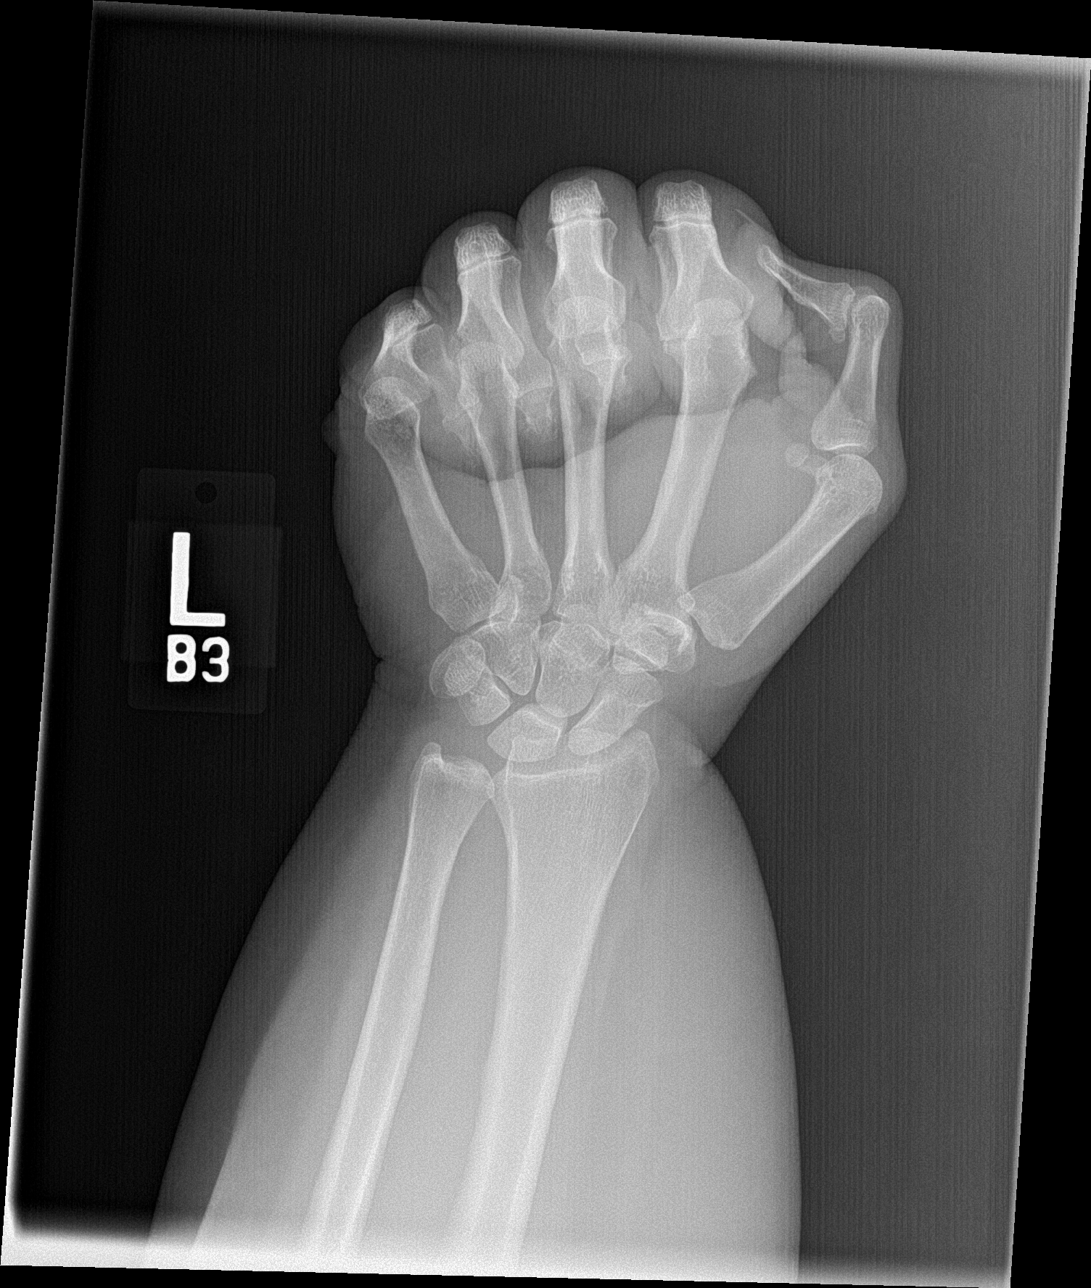

[wrist lat]
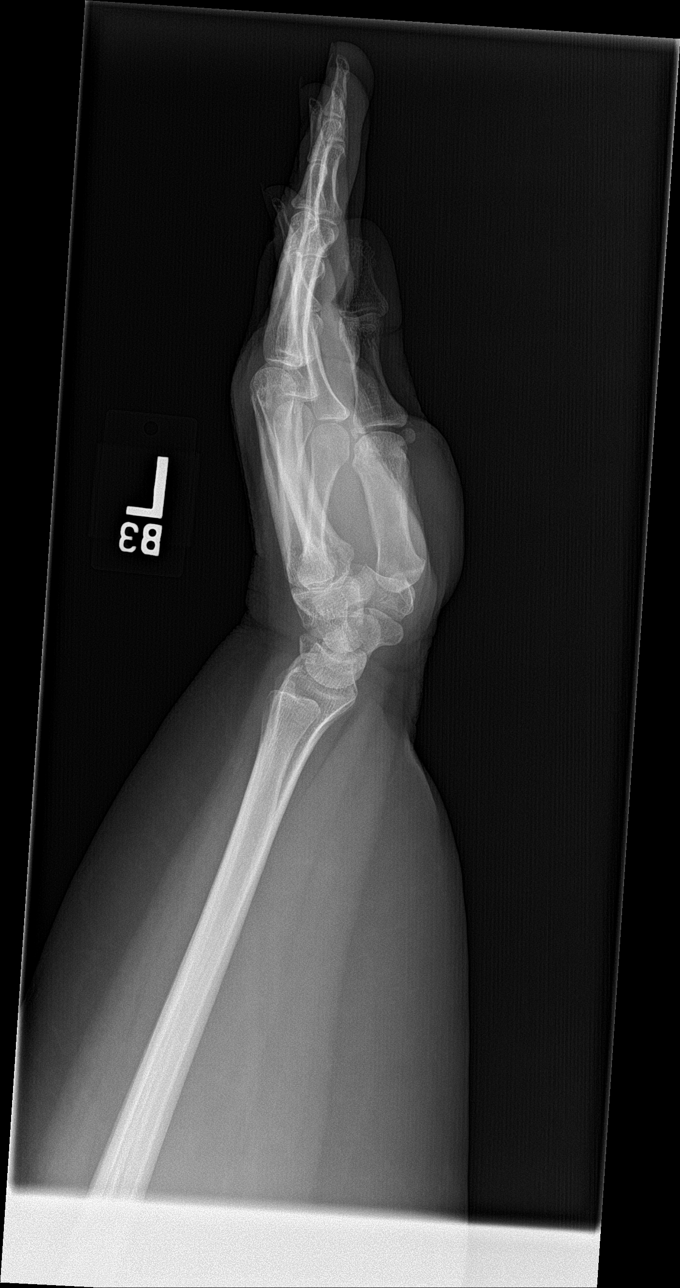

[wrist obl]
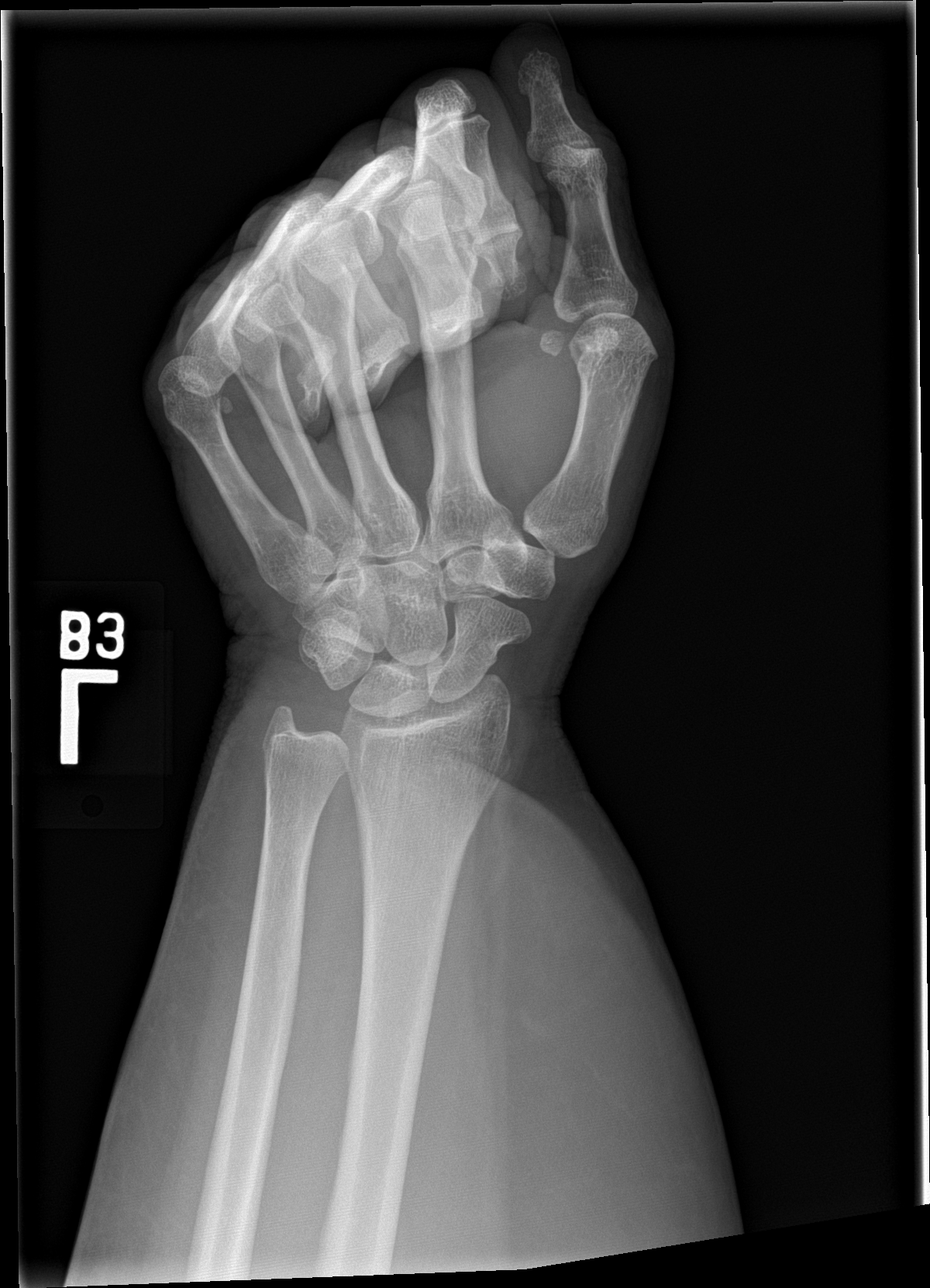

[wrist navicular]
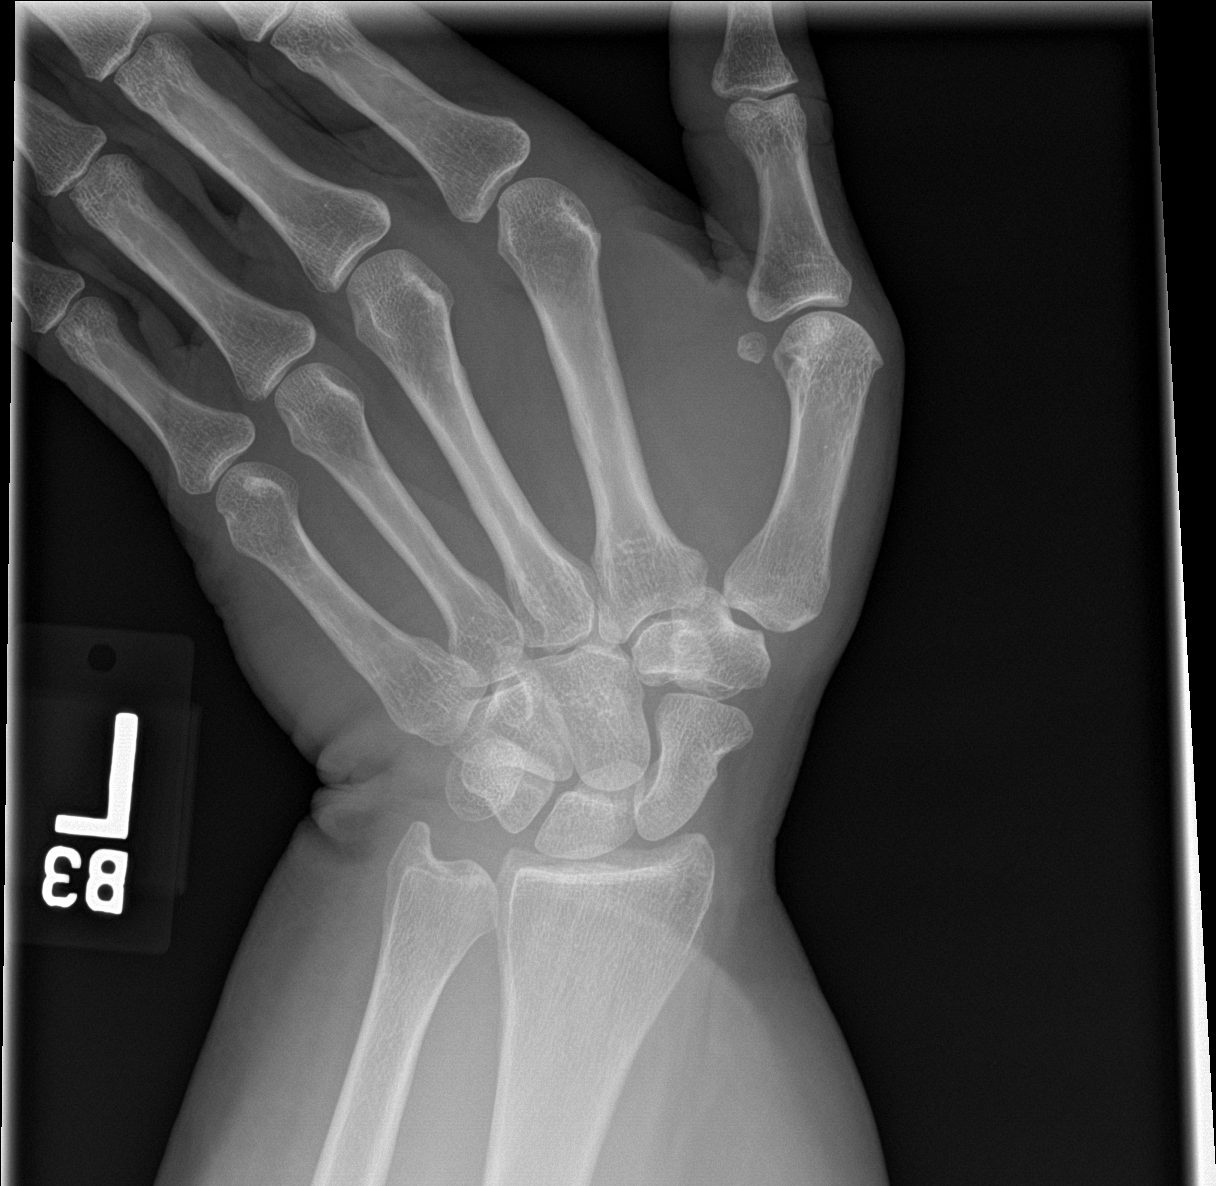

[4 of 4 positions shown; findings below may reference images not displayed]

FINDINGS: The left radiocarpal joint space appears normal and the ulnar
styloid is intact. The carpal bones are in normal position. MCP
joints appear normal. No acute fracture is seen.
IMPRESSION: Negative.
